# Patient Record
Sex: Male | Born: 1980
Health system: Southern US, Community
[De-identification: ages and names within clinical notes are randomized; demographics above are authoritative.]

## PROBLEM LIST (undated history)

## (undated) DIAGNOSIS — G43909 Migraine, unspecified, not intractable, without status migrainosus: Secondary | ICD-10-CM

## (undated) DIAGNOSIS — I10 Essential (primary) hypertension: Secondary | ICD-10-CM

## (undated) DIAGNOSIS — N200 Calculus of kidney: Secondary | ICD-10-CM

---

## 2011-01-15 ENCOUNTER — Other Ambulatory Visit: Payer: Self-pay | Admitting: Neurology

## 2011-01-15 DIAGNOSIS — M542 Cervicalgia: Secondary | ICD-10-CM

## 2011-01-20 ENCOUNTER — Other Ambulatory Visit: Payer: Self-pay | Admitting: Neurology

## 2011-01-20 DIAGNOSIS — M542 Cervicalgia: Secondary | ICD-10-CM

## 2011-01-21 ENCOUNTER — Ambulatory Visit
Admission: RE | Admit: 2011-01-21 | Discharge: 2011-01-21 | Disposition: A | Discharge status: Home / Self Care | Payer: Worker's Compensation | Source: Ambulatory Visit | Attending: Neurology | Admitting: Neurology

## 2011-01-21 ENCOUNTER — Other Ambulatory Visit: Payer: Self-pay | Admitting: Neurology

## 2011-01-21 DIAGNOSIS — M542 Cervicalgia: Secondary | ICD-10-CM

## 2012-04-07 ENCOUNTER — Emergency Department (HOSPITAL_COMMUNITY)
Admission: EM | Admit: 2012-04-07 | Discharge: 2012-04-08 | Disposition: A | Discharge status: Home / Self Care | Payer: Self-pay | Attending: Emergency Medicine | Admitting: Emergency Medicine

## 2012-04-07 ENCOUNTER — Emergency Department (HOSPITAL_COMMUNITY): Payer: Self-pay

## 2012-04-07 ENCOUNTER — Encounter (HOSPITAL_COMMUNITY): Payer: Self-pay | Admitting: Emergency Medicine

## 2012-04-07 DIAGNOSIS — M2550 Pain in unspecified joint: Secondary | ICD-10-CM | POA: Insufficient documentation

## 2012-04-07 DIAGNOSIS — S8263XA Displaced fracture of lateral malleolus of unspecified fibula, initial encounter for closed fracture: Secondary | ICD-10-CM | POA: Insufficient documentation

## 2012-04-07 DIAGNOSIS — Y929 Unspecified place or not applicable: Secondary | ICD-10-CM | POA: Insufficient documentation

## 2012-04-07 DIAGNOSIS — X58XXXA Exposure to other specified factors, initial encounter: Secondary | ICD-10-CM | POA: Insufficient documentation

## 2012-04-07 DIAGNOSIS — M2540 Effusion, unspecified joint: Secondary | ICD-10-CM | POA: Insufficient documentation

## 2012-04-07 DIAGNOSIS — Y9366 Activity, soccer: Secondary | ICD-10-CM | POA: Insufficient documentation

## 2012-04-07 NOTE — ED Notes (Signed)
Pt alert, arrives from home, c/o right ankle pain, onset was this evening, pt was playing soccer, + edema, ice applied per pt, resp even unlabored, skin pwd

## 2012-04-07 NOTE — ED Notes (Signed)
Patient transported to X-ray 

## 2012-04-08 ENCOUNTER — Encounter (HOSPITAL_COMMUNITY): Payer: Self-pay | Admitting: Emergency Medicine

## 2012-04-08 ENCOUNTER — Emergency Department (HOSPITAL_COMMUNITY)
Admission: EM | Admit: 2012-04-08 | Discharge: 2012-04-08 | Disposition: A | Discharge status: Home / Self Care | Payer: Self-pay | Attending: Emergency Medicine | Admitting: Emergency Medicine

## 2012-04-08 ENCOUNTER — Emergency Department (HOSPITAL_COMMUNITY): Payer: Self-pay

## 2012-04-08 DIAGNOSIS — S8990XA Unspecified injury of unspecified lower leg, initial encounter: Secondary | ICD-10-CM | POA: Insufficient documentation

## 2012-04-08 DIAGNOSIS — M25473 Effusion, unspecified ankle: Secondary | ICD-10-CM | POA: Insufficient documentation

## 2012-04-08 DIAGNOSIS — X58XXXA Exposure to other specified factors, initial encounter: Secondary | ICD-10-CM | POA: Insufficient documentation

## 2012-04-08 DIAGNOSIS — S99919A Unspecified injury of unspecified ankle, initial encounter: Secondary | ICD-10-CM

## 2012-04-08 DIAGNOSIS — Y929 Unspecified place or not applicable: Secondary | ICD-10-CM | POA: Insufficient documentation

## 2012-04-08 DIAGNOSIS — S93409A Sprain of unspecified ligament of unspecified ankle, initial encounter: Secondary | ICD-10-CM | POA: Insufficient documentation

## 2012-04-08 DIAGNOSIS — M25476 Effusion, unspecified foot: Secondary | ICD-10-CM | POA: Insufficient documentation

## 2012-04-08 DIAGNOSIS — Y939 Activity, unspecified: Secondary | ICD-10-CM | POA: Insufficient documentation

## 2012-04-08 MED ORDER — HYDROCODONE-ACETAMINOPHEN 5-325 MG PO TABS
1.0000 | ORAL_TABLET | Freq: Once | ORAL | Status: AC
  Administered 2012-04-08: 1 via ORAL
  Filled 2012-04-08: qty 1

## 2012-04-08 MED ORDER — DIAZEPAM 5 MG/ML IJ SOLN
5.0000 mg | Freq: Once | INTRAMUSCULAR | Status: AC
  Administered 2012-04-08: 10 mg via INTRAVENOUS
  Filled 2012-04-08: qty 2

## 2012-04-08 MED ORDER — HYDROCODONE-ACETAMINOPHEN 5-500 MG PO TABS: 1.0000 | ORAL_TABLET | Freq: Four times a day (QID) | ORAL | Status: DC | PRN

## 2012-04-08 MED ORDER — OXYCODONE-ACETAMINOPHEN 5-325 MG PO TABS: 1.0000 | ORAL_TABLET | ORAL | Status: DC | PRN

## 2012-04-08 MED ORDER — HYDROMORPHONE HCL PF 1 MG/ML IJ SOLN
1.0000 mg | Freq: Once | INTRAMUSCULAR | Status: AC
  Administered 2012-04-08: 1 mg via INTRAVENOUS
  Filled 2012-04-08: qty 1

## 2012-04-08 MED ORDER — IBUPROFEN 600 MG PO TABS: 600.0000 mg | ORAL_TABLET | Freq: Four times a day (QID) | ORAL | Status: DC | PRN

## 2012-04-08 MED ORDER — CYCLOBENZAPRINE HCL 10 MG PO TABS: 10.0000 mg | ORAL_TABLET | Freq: Two times a day (BID) | ORAL | Status: DC | PRN

## 2012-04-08 NOTE — ED Provider Notes (Signed)
History     CSN: [id number]  Arrival date & time 04/07/12  2232   First MD Initiated Contact with Patient 04/07/12 2340      Chief Complaint  Patient presents with  . Ankle Pain    (Consider location/radiation/quality/duration/timing/severity/associated sxs/prior treatment) HPI Joseph Foley is a 31 y.o. male who presents with complaint of rolling right ankle while playing soccer. Pt states he has pain to the outside of the ankle, swelling, and deformity. Denies injury to the same ankle in the past. Denies any pain in the knee. No other injuries.    History reviewed. No pertinent past medical history.  History reviewed. No pertinent past surgical history.  No family history on file.  History  Substance Use Topics  . Smoking status: Never Smoker   . Smokeless tobacco: Not on file  . Alcohol Use: No      Review of Systems  Constitutional: Negative for fever and chills.  Respiratory: Negative.   Cardiovascular: Negative.   Musculoskeletal: Positive for joint swelling and arthralgias.  Skin: Negative.   Neurological: Negative for weakness and numbness.    Allergies  Review of patient's allergies indicates no known allergies.  Home Medications  No current outpatient prescriptions on file.  BP 142/70  Pulse 79  Temp 98 F (36.7 C) (Oral)  Resp 16  Wt 180 lb (81.647 kg)  SpO2 99%  Physical Exam  Nursing note and vitals reviewed. Constitutional: He appears well-developed and well-nourished. No distress.  HENT:  Head: Atraumatic.  Eyes: Conjunctivae normal are normal.  Neck: Neck supple.  Cardiovascular: Normal rate, regular rhythm and normal heart sounds.   Pulmonary/Chest: Effort normal and breath sounds normal. No respiratory distress. He has no wheezes. He has no rales.  Musculoskeletal: He exhibits edema and tenderness.       Right ankle appears swollen, more over the lateral maleollus. Tender to palpation over lateral and medial maelloli. Pain with any  rom of the joint. Normal dorsal pedal pulse. Normal toe sensation and rom. Normal knee exam.   Neurological: He is alert.  Skin: Skin is warm and dry.    ED Course  Procedures (including critical care time)  Labs Reviewed - No data to display Dg Ankle Complete Right  04/07/2012  *RADIOLOGY REPORT*  Clinical Data: Ankle pain, swelling.  Soccer injury.  RIGHT ANKLE - COMPLETE 3+ VIEW  Comparison: None.  Findings: Lateral soft tissue swelling.  There is an avulsion fracture of the tip of the lateral malleolus.  No tibial abnormality.  IMPRESSION: Avulsion fracture off the tip of the lateral malleolus.   Original Report Authenticated By: Charlett Nose, M.D.      1. Avulsion fracture of lateral malleolus       MDM  Pt with right ankle injury while playing soccer. No other injuries. Ankle is swollen. X-ray showing avulsion fracture of the tip of lateral malleolus. Ankle/foot neurovascularly intact. CAM walker and crutches provided, will d/c home with orthopedics follow up.           Lottie Mussel, PA 04/08/12 [phone]

## 2012-04-08 NOTE — ED Notes (Signed)
Oxygen saturation decreased to 81% after receiving medication IV for pain,  Pt placed on 3L [place]  Increased to 99 %.  Pt is calmer and resting more comfortably

## 2012-04-08 NOTE — ED Notes (Signed)
Pedal pulse present and good cap refill in toes of injured foot.

## 2012-04-08 NOTE — ED Provider Notes (Signed)
  Medical screening examination/treatment/procedure(s) were performed by non-physician practitioner and as supervising physician I was immediately available for consultation/collaboration.    Vida Roller, MD 04/08/12 2322

## 2012-04-08 NOTE — ED Notes (Signed)
Pt alert, arrives from home, c/o cont pain associated with previous injury, per family prescriptions filled, "he just took a vicodin", resp even unlabored, skin pwd

## 2012-04-08 NOTE — ED Provider Notes (Signed)
  Medical screening examination/treatment/procedure(s) were performed by non-physician practitioner and as supervising physician I was immediately available for consultation/collaboration.    Tandrea Kommer D Daaron Dimarco, MD 04/08/12 2322 

## 2012-04-08 NOTE — ED Provider Notes (Signed)
History     CSN: [id number]  Arrival date & time 04/08/12  0330   First MD Initiated Contact with Patient 04/08/12 0406      Chief Complaint  Patient presents with  . Ankle Pain    (Consider location/radiation/quality/duration/timing/severity/associated sxs/prior treatment) Patient is a 31 y.o. male presenting with ankle pain. The history is provided by the patient.  Ankle Pain  The incident occurred 6 to 12 hours ago. Pertinent negatives include no numbness. Associated symptoms comments: He was seen here earlier tonight for right ankle injury with x-rays that showed an ankle sprain. He returns with severe pain and significant swelling and discoloration of the ankle. He took the pain medication given by prescription but reports no change or relief. .    History reviewed. No pertinent past medical history.  History reviewed. No pertinent past surgical history.  No family history on file.  History  Substance Use Topics  . Smoking status: Never Smoker   . Smokeless tobacco: Not on file  . Alcohol Use: No      Review of Systems  Constitutional: Negative for fever and chills.  Musculoskeletal: Negative.        See HPI  Skin: Positive for color change. Negative for wound.  Neurological: Negative.  Negative for numbness.    Allergies  Review of patient's allergies indicates no known allergies.  Home Medications   Current Outpatient Rx  Name  Route  Sig  Dispense  Refill  . HYDROCODONE-ACETAMINOPHEN 5-500 MG PO TABS   Oral   Take 1-2 tablets by mouth every 6 (six) hours as needed for pain.   15 tablet   0   . IBUPROFEN 600 MG PO TABS   Oral   Take 1 tablet (600 mg total) by mouth every 6 (six) hours as needed for pain.   30 tablet   0     BP 137/75  Pulse 82  Temp 98.6 F (37 C) (Oral)  Resp 22  SpO2 100%  Physical Exam  Constitutional: He is oriented to person, place, and time. He appears well-developed and well-nourished.  Neck: Normal range of  motion.  Cardiovascular:       Capillary refill in right foot WNL.  Pulmonary/Chest: Effort normal.  Musculoskeletal: Normal range of motion.       Marked swelling of left ankle. No obvious bony deformity. Swelling extends to forefoot without ecchymosis. Achilles intact. No calf tenderness.   Neurological: He is alert and oriented to person, place, and time.  Skin: Skin is warm and dry.       Ecchymosis of right ankle.   Psychiatric: He has a normal mood and affect.    ED Course  Procedures (including critical care time)  Labs Reviewed - No data to display Dg Ankle Complete Right  04/07/2012  *RADIOLOGY REPORT*  Clinical Data: Ankle pain, swelling.  Soccer injury.  RIGHT ANKLE - COMPLETE 3+ VIEW  Comparison: None.  Findings: Lateral soft tissue swelling.  There is an avulsion fracture of the tip of the lateral malleolus.  No tibial abnormality.  IMPRESSION: Avulsion fracture off the tip of the lateral malleolus.   Original Report Authenticated By: Charlett Nose, M.D.      No diagnosis found.  1. Ankle injury.  MDM  Ankle is significantly swollen with pain out of proportion to diagnosis of sprain. CT negative. Ice and elevation encouraged to reduce swelling. Pain managed in ED. Cap refill, DP pulses present and normal on re-evaluation. Ortho follow up  also strongly encouraged as ligament or tendon rupture cannot be excluded.         Rodena Medin, PA-C 04/08/12 [phone]

## 2012-04-21 ENCOUNTER — Encounter (HOSPITAL_COMMUNITY): Payer: Self-pay | Admitting: *Deleted

## 2012-04-21 ENCOUNTER — Emergency Department (HOSPITAL_COMMUNITY): Payer: Self-pay

## 2012-04-21 ENCOUNTER — Emergency Department (HOSPITAL_COMMUNITY)
Admission: EM | Admit: 2012-04-21 | Discharge: 2012-04-22 | Disposition: A | Discharge status: Home / Self Care | Payer: Self-pay | Attending: Emergency Medicine | Admitting: Emergency Medicine

## 2012-04-21 DIAGNOSIS — R51 Headache: Secondary | ICD-10-CM | POA: Insufficient documentation

## 2012-04-21 DIAGNOSIS — R42 Dizziness and giddiness: Secondary | ICD-10-CM | POA: Insufficient documentation

## 2012-04-21 DIAGNOSIS — Z8781 Personal history of (healed) traumatic fracture: Secondary | ICD-10-CM | POA: Insufficient documentation

## 2012-04-21 DIAGNOSIS — R5383 Other fatigue: Secondary | ICD-10-CM | POA: Insufficient documentation

## 2012-04-21 DIAGNOSIS — R079 Chest pain, unspecified: Secondary | ICD-10-CM

## 2012-04-21 DIAGNOSIS — R5381 Other malaise: Secondary | ICD-10-CM | POA: Insufficient documentation

## 2012-04-21 DIAGNOSIS — R0602 Shortness of breath: Secondary | ICD-10-CM | POA: Insufficient documentation

## 2012-04-21 DIAGNOSIS — R0789 Other chest pain: Secondary | ICD-10-CM | POA: Insufficient documentation

## 2012-04-21 LAB — CBC
Hemoglobin: 14.7 g/dL (ref 13.0–17.0)
MCH: 30.6 pg (ref 26.0–34.0)
RBC: 4.8 MIL/uL (ref 4.22–5.81)

## 2012-04-21 LAB — BASIC METABOLIC PANEL
CO2: 26 mEq/L (ref 19–32)
Chloride: 98 mEq/L (ref 96–112)
GFR calc non Af Amer: >90 mL/min (ref >90)
Glucose, Bld: 115 mg/dL — ABNORMAL HIGH (ref 70–99)
Potassium: 3.9 mEq/L (ref 3.5–5.1)
Sodium: 136 mEq/L (ref 135–145)

## 2012-04-21 LAB — POCT I-STAT TROPONIN I: Troponin i, poc: 0 ng/mL (ref 0.00–0.08)

## 2012-04-21 MED ORDER — ONDANSETRON 4 MG PO TBDP
4.0000 mg | ORAL_TABLET | Freq: Once | ORAL | Status: DC
  Filled 2012-04-21: qty 1

## 2012-04-21 MED ORDER — METOCLOPRAMIDE HCL 5 MG/ML IJ SOLN
10.0000 mg | Freq: Once | INTRAMUSCULAR | Status: AC
  Administered 2012-04-21: 10 mg via INTRAMUSCULAR
  Filled 2012-04-21: qty 2

## 2012-04-21 NOTE — ED Notes (Signed)
MD at bedside. 

## 2012-04-21 NOTE — ED Notes (Signed)
Pt c/o dizziness, vomiting x 1 and sensation of being unable to catch his breath. Pt denies lung disease. Pt is in no respiratory distress at this time.

## 2012-04-21 NOTE — ED Provider Notes (Signed)
9:47 PM  Date: 04/21/2012  Rate:95  Rhythm: normal sinus rhythm  QRS Axis: normal  Intervals: normal PQRS:  Left atrial abnormality.  ST/T Wave abnormalities: Inverted T waves in inferior and lateral leads.  Conduction Disutrbances:none  Narrative Interpretation: Abnormal EKG  Old EKG Reviewed: none available    Joseph Cooper III, MD 04/21/12 2149

## 2012-04-21 NOTE — ED Provider Notes (Addendum)
History     CSN: [id number]  Arrival date & time 04/21/12  2105   First MD Initiated Contact with Patient 04/21/12 2216      Chief Complaint  Patient presents with  . Emesis  . Shortness of Breath  . Dizziness    (Consider location/radiation/quality/duration/timing/severity/associated sxs/prior treatment) The history is provided by the patient.  Joseph Foley is a 31 y.o. male here with chest pain, SOB. Around 2pm today, he had acute onset chest pain and SOB. Pain is intermittent, sense of pressure. He then vomited and felt better. He also felt that he was unable to catch his breath. He had a L ankle fracture 2 weeks ago and still wearing a boot. No hx of DVT/PE. He also felt generalized weakness, dizziness, and headache. No risk factors for ACS.   History reviewed. No pertinent past medical history.  History reviewed. No pertinent past surgical history.  History reviewed. No pertinent family history.  History  Substance Use Topics  . Smoking status: Never Smoker   . Smokeless tobacco: Not on file  . Alcohol Use: No      Review of Systems  Respiratory: Positive for shortness of breath.   Cardiovascular: Positive for chest pain.  All other systems reviewed and are negative.    Allergies  Review of patient's allergies indicates no known allergies.  Home Medications   Current Outpatient Rx  Name  Route  Sig  Dispense  Refill  . CYCLOBENZAPRINE HCL 10 MG PO TABS   Oral   Take 1 tablet (10 mg total) by mouth 2 (two) times daily as needed for muscle spasms.   20 tablet   0   . IBUPROFEN 600 MG PO TABS   Oral   Take 1 tablet (600 mg total) by mouth every 6 (six) hours as needed for pain.   30 tablet   0   . OXYCODONE-ACETAMINOPHEN 5-325 MG PO TABS   Oral   Take 1 tablet by mouth every 4 (four) hours as needed for pain.   20 tablet   0     BP 150/89  Pulse 101  Temp 98.3 F (36.8 C) (Oral)  Resp 18  SpO2 100%  Physical Exam  Nursing note and vitals  reviewed. Constitutional: He is oriented to person, place, and time. He appears well-developed and well-nourished.       NAD   HENT:  Head: Normocephalic.  Mouth/Throat: Oropharynx is clear and moist.  Eyes: Conjunctivae normal are normal. Pupils are equal, round, and reactive to light.  Neck: Normal range of motion. Neck supple.  Cardiovascular: Normal rate, regular rhythm and normal heart sounds.   Pulmonary/Chest: Effort normal and breath sounds normal. No respiratory distress. He has no wheezes. He has no rales.  Abdominal: Soft. Bowel sounds are normal. He exhibits no distension. There is no tenderness. There is no rebound.  Musculoskeletal: Normal range of motion.       R walking boot in place. R leg mildly swollen, no calf tenderness.   Neurological: He is alert and oriented to person, place, and time.  Skin: Skin is warm and dry.  Psychiatric: He has a normal mood and affect. His behavior is normal. Judgment and thought content normal.    ED Course  Procedures (including critical care time)  Labs Reviewed  BASIC METABOLIC PANEL - Abnormal; Notable for the following:    Glucose, Bld 115 (*)     All other components within normal limits  CBC - Abnormal; Notable  for the following:    WBC 13.9 (*)     All other components within normal limits  POCT I-STAT TROPONIN I  D-DIMER, QUANTITATIVE  TROPONIN I   Dg Chest 2 View  04/21/2012  *RADIOLOGY REPORT*  Clinical Data: Short of breath.  Dizziness.  Vomiting.  CHEST - 2 VIEW  Comparison: None.  Findings:  Cardiopericardial silhouette within normal limits. Mediastinal contours normal. Trachea midline.  No airspace disease or effusion.  BB projects over the right upper chest.  This is not seen on the lateral view. Monitoring leads are projected over the chest.  IMPRESSION: No acute cardiopulmonary disease.   Original Report Authenticated By: Andreas Newport, M.D.      1. Chest pain   2. Headache      Date: 04/21/2012  Rate:  95  Rhythm: normal sinus rhythm  QRS Axis: normal  Intervals: normal  ST/T Wave abnormalities: TWI inferiorly  Conduction Disutrbances:none  Narrative Interpretation:   Old EKG Reviewed: none available    MDM  Sherril Hertenstein is a 31 y.o. male here with SOB, CP. Low risk for ACS and trop neg x 1. But need to consider PE given recent R leg immobilization from ankle fracture. Will check d-dimer, labs and reassess.   11:24 PM Patient still has some headache. Headache likely tension headache and there are no red flags for subarachnoid hemorrhage. D-dimer neg. Trop neg x 1, but EKG abnormal with no previous. Will get trop at 1am. I signed out to Dr. Patria Mane in the ED.        Richardean Canal, MD 04/21/12 2324  Richardean Canal, MD 04/21/12 [phone]

## 2012-04-22 LAB — TROPONIN I: Troponin I: <0.3 ng/mL (ref <0.30)

## 2012-04-22 MED ORDER — MORPHINE SULFATE 4 MG/ML IJ SOLN
4.0000 mg | Freq: Once | INTRAMUSCULAR | Status: AC
  Administered 2012-04-22: 4 mg via INTRAVENOUS
  Filled 2012-04-22: qty 1

## 2012-04-22 MED ORDER — ONDANSETRON 8 MG PO TBDP: 8.0000 mg | ORAL_TABLET | Freq: Three times a day (TID) | ORAL | Status: DC | PRN

## 2012-04-22 MED ORDER — ONDANSETRON HCL 4 MG/2ML IJ SOLN
4.0000 mg | Freq: Once | INTRAMUSCULAR | Status: AC
  Administered 2012-04-22: 4 mg via INTRAVENOUS
  Filled 2012-04-22: qty 2

## 2012-04-22 NOTE — ED Provider Notes (Signed)
1:27 AM The patient is feeling somewhat better at this time.  Troponin x2 is normal.  The patient's chest pain does not sound cardiac in nature.  The patient has no risk factors.  His d-dimer is normal.  His headache is nonfocal nonspecific.  The patient is feeling better at this time.  Discharge home in good condition.  He understands to return to the ER for new or worsening symptoms  Lyanne Co, MD 04/22/12 [phone]

## 2014-04-30 ENCOUNTER — Emergency Department (HOSPITAL_BASED_OUTPATIENT_CLINIC_OR_DEPARTMENT_OTHER)
Admission: EM | Admit: 2014-04-30 | Discharge: 2014-04-30 | Disposition: A | Discharge status: Home / Self Care | Payer: Commercial Managed Care - PPO | Attending: Emergency Medicine | Admitting: Emergency Medicine

## 2014-04-30 ENCOUNTER — Encounter (HOSPITAL_BASED_OUTPATIENT_CLINIC_OR_DEPARTMENT_OTHER): Payer: Self-pay | Admitting: *Deleted

## 2014-04-30 DIAGNOSIS — Z87442 Personal history of urinary calculi: Secondary | ICD-10-CM | POA: Insufficient documentation

## 2014-04-30 DIAGNOSIS — Z79899 Other long term (current) drug therapy: Secondary | ICD-10-CM | POA: Diagnosis not present

## 2014-04-30 DIAGNOSIS — R1013 Epigastric pain: Secondary | ICD-10-CM | POA: Insufficient documentation

## 2014-04-30 HISTORY — DX: Calculus of kidney: N20.0

## 2014-04-30 LAB — URINALYSIS, ROUTINE W REFLEX MICROSCOPIC
Glucose, UA: NEGATIVE mg/dL
Ketones, ur: 15 mg/dL — AB
LEUKOCYTES UA: NEGATIVE
NITRITE: NEGATIVE
Protein, ur: NEGATIVE mg/dL
SPECIFIC GRAVITY, URINE: 1.029 (ref 1.005–1.030)
UROBILINOGEN UA: 1 mg/dL (ref 0.0–1.0)
pH: 6 (ref 5.0–8.0)

## 2014-04-30 LAB — COMPREHENSIVE METABOLIC PANEL
ALK PHOS: 69 U/L (ref 39–117)
ALT: 27 U/L (ref 0–53)
AST: 26 U/L (ref 0–37)
Albumin: 5.1 g/dL (ref 3.5–5.2)
Anion gap: 8 (ref 5–15)
BUN: 14 mg/dL (ref 6–23)
CO2: 27 mmol/L (ref 19–32)
Calcium: 9.7 mg/dL (ref 8.4–10.5)
Chloride: 103 mEq/L (ref 96–112)
Creatinine, Ser: 0.88 mg/dL (ref 0.50–1.35)
GFR calc non Af Amer: >90 mL/min (ref >90)
GLUCOSE: 114 mg/dL — AB (ref 70–99)
POTASSIUM: 3.3 mmol/L — AB (ref 3.5–5.1)
SODIUM: 138 mmol/L (ref 135–145)
TOTAL PROTEIN: 7.9 g/dL (ref 6.0–8.3)
Total Bilirubin: 1.4 mg/dL — ABNORMAL HIGH (ref 0.3–1.2)

## 2014-04-30 LAB — CBC WITH DIFFERENTIAL/PLATELET
Basophils Absolute: 0 10*3/uL (ref 0.0–0.1)
Basophils Relative: 1 % (ref 0–1)
EOS ABS: 0.1 10*3/uL (ref 0.0–0.7)
Eosinophils Relative: 2 % (ref 0–5)
HCT: 42.2 % (ref 39.0–52.0)
Hemoglobin: 14.6 g/dL (ref 13.0–17.0)
LYMPHS ABS: 2.4 10*3/uL (ref 0.7–4.0)
LYMPHS PCT: 30 % (ref 12–46)
MCH: 31.3 pg (ref 26.0–34.0)
MCHC: 34.6 g/dL (ref 30.0–36.0)
MCV: 90.4 fL (ref 78.0–100.0)
Monocytes Absolute: 0.5 10*3/uL (ref 0.1–1.0)
Monocytes Relative: 6 % (ref 3–12)
NEUTROS PCT: 61 % (ref 43–77)
Neutro Abs: 4.9 10*3/uL (ref 1.7–7.7)
PLATELETS: 294 10*3/uL (ref 150–400)
RBC: 4.67 MIL/uL (ref 4.22–5.81)
RDW: 12.1 % (ref 11.5–15.5)
WBC: 8 10*3/uL (ref 4.0–10.5)

## 2014-04-30 LAB — URINE MICROSCOPIC-ADD ON

## 2014-04-30 LAB — LIPASE, BLOOD: Lipase: 27 U/L (ref 11–59)

## 2014-04-30 MED ORDER — FAMOTIDINE 20 MG PO TABS
20.0000 mg | ORAL_TABLET | Freq: Once | ORAL | Status: AC
  Administered 2014-04-30: 20 mg via ORAL
  Filled 2014-04-30: qty 1

## 2014-04-30 MED ORDER — MORPHINE SULFATE 2 MG/ML IJ SOLN
INTRAMUSCULAR | Status: AC
  Filled 2014-04-30: qty 1

## 2014-04-30 MED ORDER — MORPHINE SULFATE 4 MG/ML IJ SOLN
INTRAMUSCULAR | Status: AC
  Filled 2014-04-30: qty 1

## 2014-04-30 MED ORDER — TRAMADOL HCL 50 MG PO TABS: 50.0000 mg | ORAL_TABLET | Freq: Four times a day (QID) | ORAL | Status: DC | PRN

## 2014-04-30 MED ORDER — OMEPRAZOLE 20 MG PO CPDR: 40.0000 mg | DELAYED_RELEASE_CAPSULE | Freq: Every day | ORAL | Status: DC

## 2014-04-30 MED ORDER — MORPHINE SULFATE 4 MG/ML IJ SOLN
6.0000 mg | Freq: Once | INTRAMUSCULAR | Status: AC
  Administered 2014-04-30: 6 mg via INTRAVENOUS
  Filled 2014-04-30: qty 2

## 2014-04-30 NOTE — ED Notes (Signed)
Pt c/o epigastric pain x 1 day

## 2014-04-30 NOTE — ED Provider Notes (Signed)
CSN: [id number]     Arrival date & time 04/30/14  1825 History  This chart was scribed for Enid SkeensJoshua M Alfredo Spong, MD by Roxy Cedarhandni Bhalodia, ED Scribe. This patient was seen in room MH05/MH05 and the patient's care was started at 7:22 PM.   Chief Complaint  Patient presents with  . Abdominal Pain   Patient is a 33 y.o. male presenting with abdominal pain. The history is provided by the patient. No language interpreter was used.  Abdominal Pain Pain location:  RUQ and epigastric Pain quality: aching, burning, heavy and pressure   Pain radiates to:  Back Pain severity:  Severe Onset quality:  Gradual Timing:  Constant Progression:  Waxing and waning  HPI Comments: Joseph Foley is a 33 y.o. male with PMHx of H. Pylori, and kidney stones, who presents to the Emergency Department complaining of moderate, constant epigastric and RUQ abdominal pain that waxes and wanes which began last night. He describes that pain as increased pressure and burning sensation. He states that the pain radiates to his back intermittently. He states that the pain has increased since arriving to ED. He denies eating fatty meals, or increased alcohol intake. He denies associated fevers, chills, nausea, vomiting, diarrhea, or rash.  Past Medical History  Diagnosis Date  . Kidney stones    History reviewed. No pertinent past surgical history. History reviewed. No pertinent family history. History  Substance Use Topics  . Smoking status: Never Smoker   . Smokeless tobacco: Not on file  . Alcohol Use: No   Review of Systems  Gastrointestinal: Positive for abdominal pain.  All other systems reviewed and are negative.  Allergies  Review of patient's allergies indicates no known allergies.  Home Medications   Prior to Admission medications   Medication Sig Start Date End Date Taking? Authorizing Provider  omeprazole (PRILOSEC) 20 MG capsule Take 2 capsules (40 mg total) by mouth daily. 04/30/14   Enid SkeensJoshua M Psalms Olarte, MD   traMADol (ULTRAM) 50 MG tablet Take 1 tablet (50 mg total) by mouth every 6 (six) hours as needed. 04/30/14   Enid SkeensJoshua M Rinoa Garramone, MD   Triage Vitals: BP 144/77 mmHg  Pulse 94  Temp(Src) 98.7 F (37.1 C) (Oral)  Resp 16  Ht 5\' 8"  (1.727 m)  Wt 200 lb (90.719 kg)  BMI 30.42 kg/m2  SpO2 100%  Physical Exam  Constitutional: He is oriented to person, place, and time. He appears well-developed and well-nourished. No distress.  HENT:  Head: Normocephalic and atraumatic.  Eyes: Conjunctivae and EOM are normal. Pupils are equal, round, and reactive to light.  No scleral ictus noted.  Neck: Neck supple. No tracheal deviation present.  Cardiovascular: Normal rate, regular rhythm and normal heart sounds.   Pulmonary/Chest: Effort normal. No respiratory distress.  Abdominal: Soft. He exhibits no distension. There is tenderness. There is no guarding.  Mild epigastric discomfort.  Musculoskeletal: Normal range of motion.  Lymphadenopathy:    He has no cervical adenopathy.  Neurological: He is alert and oriented to person, place, and time.  Skin: Skin is warm and dry.  Psychiatric: He has a normal mood and affect. His behavior is normal.  Nursing note and vitals reviewed.  ED Course  Procedures (including critical care time)  EMERGENCY DEPARTMENT BILIARY ULTRASOUND INTERPRETATION "Study: Limited Abdominal Ultrasound of the gallbladder and common bile duct."  INDICATIONS: Abdominal pain and Nausea Indication: Multiple views of the gallbladder and common bile duct were obtained in real-time with a Multi-frequency probe." PERFORMED BY:  Myself  IMAGES ARCHIVED?: Yes FINDINGS: Gallstones absent, Gallbladder wall normal in thickness, Sonographic Murphy's sign absent and Common bile duct normal in size LIMITATIONS: Bowel Gas INTERPRETATION: Normal   DIAGNOSTIC STUDIES: Oxygen Saturation is 100% on RA, normal by my interpretation.    COORDINATION OF CARE: 7:28 PM- Discussed plans to order  diagnostic lab work and EKG. Will give patient morphine and pepcid for abdominal pain management. Will refer patient to a GI specialist. Pt advised of plan for treatment and pt agrees.  Labs Review Labs Reviewed  URINALYSIS, ROUTINE W REFLEX MICROSCOPIC - Abnormal; Notable for the following:    Color, Urine AMBER (*)    Hgb urine dipstick SMALL (*)    Bilirubin Urine SMALL (*)    Ketones, ur 15 (*)    All other components within normal limits  COMPREHENSIVE METABOLIC PANEL - Abnormal; Notable for the following:    Potassium 3.3 (*)    Glucose, Bld 114 (*)    Total Bilirubin 1.4 (*)    All other components within normal limits  CBC WITH DIFFERENTIAL  LIPASE, BLOOD  URINE MICROSCOPIC-ADD ON   Imaging Review No results found.   EKG Interpretation   Date/Time:  Tuesday April 30 2014 18:46:11 EST Ventricular Rate:  85 PR Interval:  138 QRS Duration: 92 QT Interval:  370 QTC Calculation: 440 R Axis:   57 Text Interpretation:  Normal sinus rhythm T wave abnormality, consider  inferior ischemia Abnormal ECG Confirmed by Gavan Nordby  MD, Virdie Penning ([id number]) on  04/30/2014 7:24:04 PM     MDM   Final diagnoses:  Epigastric abdominal pain     I personally performed the services described in this documentation, which was scribed in my presence. The recorded information has been reviewed and is accurate.  Patient with epigastric discomfort, no guarding or signs of peritonitis. Discussed differential diagnosis including biliary, ulcer, gastric/H pylori, hernia, other. Blood work no acute findings, mild elevated bilirubin. Pain medicines given in the ER. Patient does not have any chest pain or shortness of breath no headache history. EKG nonspecific findings done in triage.  Bedside ultrasound no acute gallbladder findings. Follow-up with gastroenterology discussed. PPI. Results and differential diagnosis were discussed with the patient/parent/guardian. Close follow up outpatient was  discussed, comfortable with the plan.   Medications  morphine 2 MG/ML injection (not administered)  morphine 4 MG/ML injection 6 mg (6 mg Intravenous Given 04/30/14 1939)  famotidine (PEPCID) tablet 20 mg (20 mg Oral Given 04/30/14 1942)    Filed Vitals:   04/30/14 1830  BP: 144/77  Pulse: 94  Temp: 98.7 F (37.1 C)  TempSrc: Oral  Resp: 16  Height: 5\' 8"  (1.727 m)  Weight: 200 lb (90.719 kg)  SpO2: 100%    Final diagnoses:  Epigastric abdominal pain      Enid SkeensJoshua M Fontaine Hehl, MD 04/30/14 2031

## 2014-04-30 NOTE — Discharge Instructions (Signed)
If you were given medicines take as directed.  If you are on coumadin or contraceptives realize their levels and effectiveness is altered by many different medicines.  If you have any reaction (rash, tongues swelling, other) to the medicines stop taking and see a physician.   Please follow up as directed and return to the ER or see a physician for new or worsening symptoms.  Thank you. Filed Vitals:   04/30/14 1830  BP: 144/77  Pulse: 94  Temp: 98.7 F (37.1 C)  TempSrc: Oral  Resp: 16  Height: 5\' 8"  (1.727 m)  Weight: 200 lb (90.719 kg)  SpO2: 100%

## 2014-10-08 ENCOUNTER — Encounter (HOSPITAL_BASED_OUTPATIENT_CLINIC_OR_DEPARTMENT_OTHER): Payer: Self-pay | Admitting: Emergency Medicine

## 2014-10-08 ENCOUNTER — Emergency Department (HOSPITAL_BASED_OUTPATIENT_CLINIC_OR_DEPARTMENT_OTHER)
Admission: EM | Admit: 2014-10-08 | Discharge: 2014-10-08 | Disposition: A | Discharge status: Home / Self Care | Payer: BLUE CROSS/BLUE SHIELD | Attending: Emergency Medicine | Admitting: Emergency Medicine

## 2014-10-08 DIAGNOSIS — R109 Unspecified abdominal pain: Secondary | ICD-10-CM | POA: Insufficient documentation

## 2014-10-08 DIAGNOSIS — Z79899 Other long term (current) drug therapy: Secondary | ICD-10-CM | POA: Insufficient documentation

## 2014-10-08 DIAGNOSIS — Z87442 Personal history of urinary calculi: Secondary | ICD-10-CM | POA: Insufficient documentation

## 2014-10-08 LAB — URINALYSIS, ROUTINE W REFLEX MICROSCOPIC
BILIRUBIN URINE: NEGATIVE
GLUCOSE, UA: NEGATIVE mg/dL
Ketones, ur: NEGATIVE mg/dL
Leukocytes, UA: NEGATIVE
Nitrite: NEGATIVE
PH: 6 (ref 5.0–8.0)
Protein, ur: NEGATIVE mg/dL
Specific Gravity, Urine: 1.029 (ref 1.005–1.030)
Urobilinogen, UA: 0.2 mg/dL (ref 0.0–1.0)

## 2014-10-08 LAB — BASIC METABOLIC PANEL
Anion gap: 5 (ref 5–15)
BUN: 12 mg/dL (ref 6–20)
CALCIUM: 9.6 mg/dL (ref 8.9–10.3)
CO2: 28 mmol/L (ref 22–32)
CREATININE: 1.04 mg/dL (ref 0.61–1.24)
Chloride: 105 mmol/L (ref 101–111)
GFR calc non Af Amer: >60 mL/min (ref >60)
GLUCOSE: 94 mg/dL (ref 65–99)
POTASSIUM: 3.6 mmol/L (ref 3.5–5.1)
Sodium: 138 mmol/L (ref 135–145)

## 2014-10-08 LAB — URINE MICROSCOPIC-ADD ON

## 2014-10-08 LAB — CBC
HCT: 43.3 % (ref 39.0–52.0)
HEMOGLOBIN: 14.4 g/dL (ref 13.0–17.0)
MCH: 30.5 pg (ref 26.0–34.0)
MCHC: 33.3 g/dL (ref 30.0–36.0)
MCV: 91.7 fL (ref 78.0–100.0)
PLATELETS: 311 10*3/uL (ref 150–400)
RBC: 4.72 MIL/uL (ref 4.22–5.81)
RDW: 12.2 % (ref 11.5–15.5)
WBC: 8.6 10*3/uL (ref 4.0–10.5)

## 2014-10-08 MED ORDER — HYDROMORPHONE HCL 1 MG/ML IJ SOLN
1.0000 mg | Freq: Once | INTRAMUSCULAR | Status: AC
  Administered 2014-10-08: 1 mg via INTRAVENOUS
  Filled 2014-10-08: qty 1

## 2014-10-08 MED ORDER — SODIUM CHLORIDE 0.9 % IV BOLUS (SEPSIS)
1000.0000 mL | INTRAVENOUS | Status: AC
  Administered 2014-10-08: 1000 mL via INTRAVENOUS

## 2014-10-08 MED ORDER — TAMSULOSIN HCL 0.4 MG PO CAPS: 0.4000 mg | ORAL_CAPSULE | Freq: Every day | ORAL | Status: DC

## 2014-10-08 MED ORDER — KETOROLAC TROMETHAMINE 30 MG/ML IJ SOLN
30.0000 mg | Freq: Once | INTRAMUSCULAR | Status: AC
  Administered 2014-10-08: 30 mg via INTRAVENOUS
  Filled 2014-10-08: qty 1

## 2014-10-08 MED ORDER — CEPHALEXIN 500 MG PO CAPS: 500.0000 mg | ORAL_CAPSULE | Freq: Three times a day (TID) | ORAL | Status: AC

## 2014-10-08 MED ORDER — HYDROMORPHONE HCL 1 MG/ML IJ SOLN
1.0000 mg | Freq: Once | INTRAMUSCULAR | Status: AC
  Administered 2014-10-08: 1 mg via INTRAVENOUS

## 2014-10-08 MED ORDER — HYDROMORPHONE HCL 1 MG/ML IJ SOLN
INTRAMUSCULAR | Status: AC
  Filled 2014-10-08: qty 1

## 2014-10-08 MED ORDER — ONDANSETRON HCL 4 MG/2ML IJ SOLN
4.0000 mg | Freq: Once | INTRAMUSCULAR | Status: AC
  Administered 2014-10-08: 4 mg via INTRAVENOUS
  Filled 2014-10-08: qty 2

## 2014-10-08 MED ORDER — OXYCODONE-ACETAMINOPHEN 5-325 MG PO TABS: 1.0000 | ORAL_TABLET | Freq: Four times a day (QID) | ORAL | Status: DC | PRN

## 2014-10-08 NOTE — ED Notes (Signed)
Pt informed that transportation home would be needed prior to receiving heavier medication states he would call father in law who could drive him home. Mr Joseph Foley was medicated with toradol and zofran at this time.

## 2014-10-08 NOTE — ED Notes (Signed)
Pt reports left flank pain that radiates to groin onset 1 week

## 2014-10-08 NOTE — ED Notes (Signed)
MD at bedside. 

## 2014-10-08 NOTE — ED Provider Notes (Signed)
CSN: [id number]     Arrival date & time 10/08/14  0612 History   First MD Initiated Contact with Patient 10/08/14 0654     Chief Complaint  Patient presents with  . Flank Pain  . Testicle Pain     (Consider location/radiation/quality/duration/timing/severity/associated sxs/prior Treatment) Patient is a 34 y.o. male presenting with flank pain and testicular pain. The history is provided by the patient.  Flank Pain This is a new problem. The current episode started more than 2 days ago. Episode frequency: intermittent. The problem has been rapidly worsening. Pertinent negatives include no chest pain, no abdominal pain, no headaches and no shortness of breath. Nothing aggravates the symptoms. Nothing relieves the symptoms. He has tried nothing for the symptoms. The treatment provided no relief.  Testicle Pain Pertinent negatives include no chest pain, no abdominal pain, no headaches and no shortness of breath.    Past Medical History  Diagnosis Date  . Kidney stones    History reviewed. No pertinent past surgical history. History reviewed. No pertinent family history. History  Substance Use Topics  . Smoking status: Never Smoker   . Smokeless tobacco: Not on file  . Alcohol Use: No    Review of Systems  Constitutional: Negative for fever.  HENT: Negative for drooling and rhinorrhea.   Eyes: Negative for pain.  Respiratory: Negative for cough and shortness of breath.   Cardiovascular: Negative for chest pain and leg swelling.  Gastrointestinal: Negative for nausea, vomiting, abdominal pain and diarrhea.  Genitourinary: Positive for flank pain and testicular pain. Negative for hematuria.  Musculoskeletal: Negative for gait problem and neck pain.  Skin: Negative for color change.  Neurological: Negative for numbness and headaches.  Hematological: Negative for adenopathy.  Psychiatric/Behavioral: Negative for behavioral problems.  All other systems reviewed and are  negative.     Allergies  Review of patient's allergies indicates no known allergies.  Home Medications   Prior to Admission medications   Medication Sig Start Date End Date Taking? Authorizing Provider  omeprazole (PRILOSEC) 20 MG capsule Take 2 capsules (40 mg total) by mouth daily. 04/30/14   Blane Ohara, MD  traMADol (ULTRAM) 50 MG tablet Take 1 tablet (50 mg total) by mouth every 6 (six) hours as needed. 04/30/14   Blane Ohara, MD   BP 141/78 mmHg  Pulse 74  Temp(Src) 98 F (36.7 C)  Resp 18  SpO2 99% Physical Exam  Constitutional: He is oriented to person, place, and time. He appears well-developed and well-nourished.  HENT:  Head: Normocephalic and atraumatic.  Right Ear: External ear normal.  Left Ear: External ear normal.  Nose: Nose normal.  Mouth/Throat: Oropharynx is clear and moist. No oropharyngeal exudate.  Eyes: Conjunctivae and EOM are normal. Pupils are equal, round, and reactive to light.  Neck: Normal range of motion. Neck supple.  Cardiovascular: Normal rate, regular rhythm, normal heart sounds and intact distal pulses.  Exam reveals no gallop and no friction rub.   No murmur heard. Pulmonary/Chest: Effort normal and breath sounds normal. No respiratory distress. He has no wheezes.  Abdominal: Soft. Bowel sounds are normal. He exhibits no distension. There is no tenderness. There is no rebound and no guarding.  Musculoskeletal: Normal range of motion. He exhibits no edema or tenderness.  Neurological: He is alert and oriented to person, place, and time.  Skin: Skin is warm and dry.  Psychiatric: He has a normal mood and affect. His behavior is normal.  Nursing note and vitals reviewed.  ED Course  Procedures (including critical care time) Labs Review Labs Reviewed  URINALYSIS, ROUTINE W REFLEX MICROSCOPIC (NOT AT [facility]) - Abnormal; Notable for the following:    Color, Urine AMBER (*)    Hgb urine dipstick LARGE (*)    All other components  within normal limits  URINE MICROSCOPIC-ADD ON - Abnormal; Notable for the following:    Bacteria, UA FEW (*)    All other components within normal limits  URINE CULTURE  CBC  BASIC METABOLIC PANEL    Imaging Review No results found.   EKG Interpretation None      MDM   Final diagnoses:  Left flank pain    7:03 AM 34 y.o. male with self-reported history of kidney stones who presents with left flank pain which she initially noticed 6 days ago. He denies any fever, vomiting, or diarrhea. He states the pain has been intermittent but worsened this morning. The pain radiates from his left flank down to his left groin and testicle. He states this is consistent with all previous episodes of renal colic. He states that he typically goes to [facility] as we have no visits for kidney stones in our system. We'll get screening labs and symptom control. Given long history of kidney stones, do not think imaging is needed.  I offered a testicular and genital exam due to the pain radiation to the left testicle. The patient declined and states that his kidney stone pain is always consistent with this. He understands that w/out examination I cannot assess for a testicular torsion.   11:30 AM: UA w/ few bact. Will cover w/ keflex. Likely reactive, will send cx. Pain controlled.  I have discussed the diagnosis/risks/treatment options with the patient and believe the pt to be eligible for discharge home to follow-up with urology. We also discussed returning to the ED immediately if new or worsening sx occur. We discussed the sx which are most concerning (e.g., worsening pain, fever) that necessitate immediate return. Medications administered to the patient during their visit and any new prescriptions provided to the patient are listed below.  Medications given during this visit Medications  ketorolac (TORADOL) 30 MG/ML injection 30 mg (30 mg Intravenous Given 10/08/14 0651)  ondansetron (ZOFRAN)  injection 4 mg (4 mg Intravenous Given 10/08/14 0651)  sodium chloride 0.9 % bolus 1,000 mL (0 mLs Intravenous Stopped 10/08/14 0826)  HYDROmorphone (DILAUDID) injection 1 mg (1 mg Intravenous Given 10/08/14 0708)  HYDROmorphone (DILAUDID) injection 1 mg (1 mg Intravenous Given 10/08/14 0826)  sodium chloride 0.9 % bolus 1,000 mL (0 mLs Intravenous Stopped 10/08/14 1105)  HYDROmorphone (DILAUDID) injection 1 mg (1 mg Intravenous Given 10/08/14 1108)    New Prescriptions   CEPHALEXIN (KEFLEX) 500 MG CAPSULE    Take 1 capsule (500 mg total) by mouth 3 (three) times daily.   OXYCODONE-ACETAMINOPHEN (PERCOCET) 5-325 MG PER TABLET    Take 1-2 tablets by mouth every 6 (six) hours as needed.   TAMSULOSIN (FLOMAX) 0.4 MG CAPS CAPSULE    Take 1 capsule (0.4 mg total) by mouth daily after breakfast.     Purvis SheffieldForrest Kirby Argueta, MD 10/08/14 1130

## 2014-10-08 NOTE — ED Notes (Signed)
Pt leaving with driver , instructed not to drive x 6 hrs , verbal understanding

## 2014-10-10 LAB — URINE CULTURE
CULTURE: NO GROWTH
Colony Count: NO GROWTH

## 2015-07-01 ENCOUNTER — Emergency Department (HOSPITAL_BASED_OUTPATIENT_CLINIC_OR_DEPARTMENT_OTHER)
Admission: EM | Admit: 2015-07-01 | Discharge: 2015-07-01 | Disposition: A | Discharge status: Home / Self Care | Payer: Managed Care, Other (non HMO) | Attending: Physician Assistant | Admitting: Physician Assistant

## 2015-07-01 ENCOUNTER — Encounter (HOSPITAL_BASED_OUTPATIENT_CLINIC_OR_DEPARTMENT_OTHER): Payer: Self-pay | Admitting: *Deleted

## 2015-07-01 ENCOUNTER — Emergency Department (HOSPITAL_BASED_OUTPATIENT_CLINIC_OR_DEPARTMENT_OTHER): Payer: Managed Care, Other (non HMO)

## 2015-07-01 DIAGNOSIS — Z79899 Other long term (current) drug therapy: Secondary | ICD-10-CM | POA: Insufficient documentation

## 2015-07-01 DIAGNOSIS — R109 Unspecified abdominal pain: Secondary | ICD-10-CM | POA: Diagnosis present

## 2015-07-01 DIAGNOSIS — N200 Calculus of kidney: Secondary | ICD-10-CM | POA: Insufficient documentation

## 2015-07-01 LAB — URINALYSIS, ROUTINE W REFLEX MICROSCOPIC
BILIRUBIN URINE: NEGATIVE
Glucose, UA: NEGATIVE mg/dL
Hgb urine dipstick: NEGATIVE
KETONES UR: NEGATIVE mg/dL
LEUKOCYTES UA: NEGATIVE
NITRITE: NEGATIVE
PROTEIN: NEGATIVE mg/dL
Specific Gravity, Urine: 1.022 (ref 1.005–1.030)
pH: 7.5 (ref 5.0–8.0)

## 2015-07-01 MED ORDER — TAMSULOSIN HCL 0.4 MG PO CAPS: 0.4000 mg | ORAL_CAPSULE | Freq: Every day | ORAL | Status: DC

## 2015-07-01 MED ORDER — KETOROLAC TROMETHAMINE 30 MG/ML IJ SOLN
30.0000 mg | Freq: Once | INTRAMUSCULAR | Status: AC
  Administered 2015-07-01: 30 mg via INTRAVENOUS
  Filled 2015-07-01: qty 1

## 2015-07-01 MED ORDER — ONDANSETRON HCL 4 MG/2ML IJ SOLN: 4.0000 mg | Freq: Once | INTRAMUSCULAR | Status: DC

## 2015-07-01 MED ORDER — ONDANSETRON HCL 8 MG PO TABS
4.0000 mg | ORAL_TABLET | Freq: Once | ORAL | Status: AC
  Administered 2015-07-01: 4 mg via ORAL
  Filled 2015-07-01: qty 1

## 2015-07-01 MED ORDER — SODIUM CHLORIDE 0.9 % IV BOLUS (SEPSIS)
1000.0000 mL | Freq: Once | INTRAVENOUS | Status: AC
  Administered 2015-07-01: 1000 mL via INTRAVENOUS

## 2015-07-01 MED ORDER — OXYCODONE-ACETAMINOPHEN 5-325 MG PO TABS
1.0000 | ORAL_TABLET | Freq: Once | ORAL | Status: AC
  Administered 2015-07-01: 1 via ORAL
  Filled 2015-07-01: qty 1

## 2015-07-01 MED ORDER — ONDANSETRON HCL 4 MG PO TABS: 4.0000 mg | ORAL_TABLET | Freq: Three times a day (TID) | ORAL | Status: DC | PRN

## 2015-07-01 MED ORDER — MORPHINE SULFATE (PF) 4 MG/ML IV SOLN
4.0000 mg | Freq: Once | INTRAVENOUS | Status: AC
  Administered 2015-07-01: 4 mg via INTRAVENOUS
  Filled 2015-07-01: qty 1

## 2015-07-01 MED ORDER — OXYCODONE-ACETAMINOPHEN 5-325 MG PO TABS: 1.0000 | ORAL_TABLET | Freq: Four times a day (QID) | ORAL | Status: DC | PRN

## 2015-07-01 MED FILL — OXYCODONE/APAP 5-325: 5-325 | 2 days supply | Qty: 11 | Fill #0

## 2015-07-01 MED FILL — TAMSULOSIN HCL 0.4 MG CAP: 0.4 | 30 days supply | Qty: 30 | Fill #0

## 2015-07-01 MED FILL — ONDANSETRON HCL 4 MG TABLET: 4 | 4 days supply | Qty: 9 | Fill #0

## 2015-07-01 NOTE — ED Provider Notes (Addendum)
CSN: [id number]     Arrival date & time 07/01/15  1051 History   First MD Initiated Contact with Patient 07/01/15 1103     Chief Complaint  Patient presents with  . Flank Pain     (Consider location/radiation/quality/duration/timing/severity/associated sxs/prior Treatment) HPI   Patient is a 35 year old male with history of kidney stones presenting with left flank pain. He developed flank pain several hours prior to arrival. Patient's not taking anything for pain to prior. Patient has no primary care physician and no urologist.  No fevers. No urinary symptoms.  Patient states he has left flank pain radiates down into his groin. He states it feels exactly similar to his previous stones.  We do not have any evidence of prior stones on images here in our system.  Past Medical History  Diagnosis Date  . Kidney stones    History reviewed. No pertinent past surgical history. No family history on file. Social History  Substance Use Topics  . Smoking status: Never Smoker   . Smokeless tobacco: None  . Alcohol Use: No    Review of Systems  Constitutional: Negative for activity change.  HENT: Negative for congestion.   Respiratory: Negative for shortness of breath.   Cardiovascular: Negative for chest pain.  Gastrointestinal: Negative for abdominal pain.  Genitourinary: Positive for flank pain and penile pain. Negative for frequency, hematuria and penile swelling.  Musculoskeletal: Positive for back pain.  Neurological: Negative for dizziness.  Psychiatric/Behavioral: Negative for agitation.      Allergies  Review of patient's allergies indicates no known allergies.  Home Medications   Prior to Admission medications   Medication Sig Start Date End Date Taking? Authorizing Provider  omeprazole (PRILOSEC) 20 MG capsule Take 2 capsules (40 mg total) by mouth daily. 04/30/14   Blane Ohara, MD  oxyCODONE-acetaminophen (PERCOCET) 5-325 MG per tablet Take 1-2 tablets by mouth  every 6 (six) hours as needed. 10/08/14   Purvis Sheffield, MD  tamsulosin (FLOMAX) 0.4 MG CAPS capsule Take 1 capsule (0.4 mg total) by mouth daily after breakfast. 10/08/14   Purvis Sheffield, MD  traMADol (ULTRAM) 50 MG tablet Take 1 tablet (50 mg total) by mouth every 6 (six) hours as needed. 04/30/14   Blane Ohara, MD   BP 151/90 mmHg  Pulse 91  Temp(Src) 98.8 F (37.1 C) (Oral)  Resp 18  Ht  (1.702 m)  Wt 200 lb (90.719 kg)  BMI 31.32 kg/m2  SpO2 98% Physical Exam  Constitutional: He is oriented to person, place, and time. He appears well-nourished.  HENT:  Head: Normocephalic.  Mouth/Throat: Oropharynx is clear and moist.  Eyes: Conjunctivae are normal.  Neck: No tracheal deviation present.  Cardiovascular: Normal rate.   Pulmonary/Chest: Effort normal. No stridor. No respiratory distress.  Abdominal: Soft. There is no tenderness. There is no guarding.  No pain to palpation or pain to flank.  Musculoskeletal: Normal range of motion. He exhibits no edema.  Neurological: He is oriented to person, place, and time. No cranial nerve deficit.  Skin: Skin is warm and dry. No rash noted. He is not diaphoretic.  Psychiatric: He has a normal mood and affect. His behavior is normal.  Nursing note and vitals reviewed.   ED Course  Procedures (including critical care time) Labs Review Labs Reviewed  URINALYSIS, ROUTINE W REFLEX MICROSCOPIC (NOT AT [facility]) - Abnormal; Notable for the following:    Color, Urine AMBER (*)    All other components within normal limits    Imaging  Review Ct Renal Stone Study  07/01/2015  CLINICAL DATA:  Left flank pain since this morning. EXAM: CT ABDOMEN AND PELVIS WITHOUT CONTRAST TECHNIQUE: Multidetector CT imaging of the abdomen and pelvis was performed following the standard protocol without IV contrast. COMPARISON:  None. FINDINGS: Lower chest:  Lung bases are clear.  Normal heart size. Hepatobiliary: Normal liver.  Normal gallbladder. Pancreas:  Normal. Spleen: Normal. Adrenals/Urinary Tract: Normal adrenal glands. Bilateral nonobstructing nephrolithiasis. No ureterolithiasis. Normal decompressed bladder. Stomach/Bowel: No bowel wall thickening or dilatation. No pneumatosis, pneumoperitoneum or portal venous gas. Normal appendix. No abdominal or pelvic free fluid. Vascular/Lymphatic: Normal caliber abdominal aorta. No lymphadenopathy. Other: No fluid collection or hematoma. Musculoskeletal: No lytic or sclerotic osseous lesion. No acute osseous abnormality. IMPRESSION: 1. Bilateral nonobstructing nephrolithiasis. 2. Normal appendix. Electronically Signed   By: Elige Ko   On: 07/01/2015 12:09   I have personally reviewed and evaluated these images and lab results as part of my medical decision-making.   EKG Interpretation None      MDM   Final diagnoses:  None    Patient is a 35 year old male otherwise healthy with history of kidney stones presenting today with left flank pain. Patient's urine does not show any hematuria. This is atypical if the pain were stones. We'll do CT stone to confirm that stones are the origin of his pain. Patient's urine does not show any infection. He appears in NAD.  2:43 PM CT shows stones. WIll give ketoralac. No fevers or infection in urine. T[place]arting PO  Jamarria Real Randall An, MD 07/01/15 1313  Pervis Macintyre Randall An, MD 07/01/15 1444

## 2015-07-01 NOTE — ED Notes (Signed)
Patient transported to CT. Ambulatory with steady gait

## 2015-07-01 NOTE — ED Notes (Signed)
Left flank pain x 2 hours. Pain goes into his penis.

## 2015-07-01 NOTE — Discharge Instructions (Signed)
Kidney Stones °Kidney stones (urolithiasis) are deposits that form inside your kidneys. The intense pain is caused by the stone moving through the urinary tract. When the stone moves, the ureter goes into spasm around the stone. The stone is usually passed in the urine.  °CAUSES  °· A disorder that makes certain neck glands produce too much parathyroid hormone (primary hyperparathyroidism). °· A buildup of uric acid crystals, similar to gout in your joints. °· Narrowing (stricture) of the ureter. °· A kidney obstruction present at birth (congenital obstruction). °· Previous surgery on the kidney or ureters. °· Numerous kidney infections. °SYMPTOMS  °· Feeling sick to your stomach (nauseous). °· Throwing up (vomiting). °· Blood in the urine (hematuria). °· Pain that usually spreads (radiates) to the groin. °· Frequency or urgency of urination. °DIAGNOSIS  °· Taking a history and physical exam. °· Blood or urine tests. °· CT scan. °· Occasionally, an examination of the inside of the urinary bladder (cystoscopy) is performed. °TREATMENT  °· Observation. °· Increasing your fluid intake. °· Extracorporeal shock wave lithotripsy--This is a noninvasive procedure that uses shock waves to break up kidney stones. °· Surgery may be needed if you have severe pain or persistent obstruction. There are various surgical procedures. Most of the procedures are performed with the use of small instruments. Only small incisions are needed to accommodate these instruments, so recovery time is minimized. °The size, location, and chemical composition are all important variables that will determine the proper choice of action for you. Talk to your health care provider to better understand your situation so that you will minimize the risk of injury to yourself and your kidney.  °HOME CARE INSTRUCTIONS  °· Drink enough water and fluids to keep your urine clear or pale yellow. This will help you to pass the stone or stone fragments. °· Strain  all urine through the provided strainer. Keep all particulate matter and stones for your health care provider to see. The stone causing the pain may be as small as a grain of salt. It is very important to use the strainer each and every time you pass your urine. The collection of your stone will allow your health care provider to analyze it and verify that a stone has actually passed. The stone analysis will often identify what you can do to reduce the incidence of recurrences. °· Only take over-the-counter or prescription medicines for pain, discomfort, or fever as directed by your health care provider. °· Keep all follow-up visits as told by your health care provider. This is important. °· Get follow-up X-rays if required. The absence of pain does not always mean that the stone has passed. It may have only stopped moving. If the urine remains completely obstructed, it can cause loss of kidney function or even complete destruction of the kidney. It is your responsibility to make sure X-rays and follow-ups are completed. Ultrasounds of the kidney can show blockages and the status of the kidney. Ultrasounds are not associated with any radiation and can be performed easily in a matter of minutes. °· Make changes to your daily diet as told by your health care provider. You may be told to: °¨ Limit the amount of salt that you eat. °¨ Eat 5 or more servings of fruits and vegetables each day. °¨ Limit the amount of meat, poultry, fish, and eggs that you eat. °· Collect a 24-hour urine sample as told by your health care provider. You may need to collect another urine sample every 6-12   months. °SEEK MEDICAL CARE IF: °· You experience pain that is progressive and unresponsive to any pain medicine you have been prescribed. °SEEK IMMEDIATE MEDICAL CARE IF:  °· Pain cannot be controlled with the prescribed medicine. °· You have a fever or shaking chills. °· The severity or intensity of pain increases over 18 hours and is not  relieved by pain medicine. °· You develop a new onset of abdominal pain. °· You feel faint or pass out. °· You are unable to urinate. °  °This information is not intended to replace advice given to you by your health care provider. Make sure you discuss any questions you have with your health care provider. °  °Document Released: 04/19/2005 Document Revised: 01/08/2015 Document Reviewed: 09/20/2012 °Elsevier Interactive Patient Education ©2016 [employer] ° °

## 2015-07-14 ENCOUNTER — Encounter (HOSPITAL_BASED_OUTPATIENT_CLINIC_OR_DEPARTMENT_OTHER): Payer: Self-pay | Admitting: *Deleted

## 2015-07-14 DIAGNOSIS — R51 Headache: Secondary | ICD-10-CM | POA: Insufficient documentation

## 2015-07-14 DIAGNOSIS — Z87442 Personal history of urinary calculi: Secondary | ICD-10-CM | POA: Diagnosis not present

## 2015-07-14 DIAGNOSIS — Z8679 Personal history of other diseases of the circulatory system: Secondary | ICD-10-CM | POA: Diagnosis not present

## 2015-07-14 DIAGNOSIS — H53149 Visual discomfort, unspecified: Secondary | ICD-10-CM | POA: Diagnosis not present

## 2015-07-14 DIAGNOSIS — R112 Nausea with vomiting, unspecified: Secondary | ICD-10-CM | POA: Diagnosis not present

## 2015-07-14 NOTE — ED Notes (Signed)
Pt woke up with a migraine.  Reports hx of same. Vomit x 1 PTA

## 2015-07-15 ENCOUNTER — Emergency Department (HOSPITAL_BASED_OUTPATIENT_CLINIC_OR_DEPARTMENT_OTHER)
Admission: EM | Admit: 2015-07-15 | Discharge: 2015-07-15 | Disposition: A | Discharge status: Home / Self Care | Payer: Managed Care, Other (non HMO) | Attending: Emergency Medicine | Admitting: Emergency Medicine

## 2015-07-15 DIAGNOSIS — R51 Headache: Secondary | ICD-10-CM

## 2015-07-15 DIAGNOSIS — R519 Headache, unspecified: Secondary | ICD-10-CM

## 2015-07-15 HISTORY — DX: Migraine, unspecified, not intractable, without status migrainosus: G43.909

## 2015-07-15 MED ORDER — SODIUM CHLORIDE 0.9 % IV BOLUS (SEPSIS)
1000.0000 mL | Freq: Once | INTRAVENOUS | Status: AC
  Administered 2015-07-15: 1000 mL via INTRAVENOUS

## 2015-07-15 MED ORDER — METOCLOPRAMIDE HCL 5 MG/ML IJ SOLN
10.0000 mg | Freq: Once | INTRAMUSCULAR | Status: AC
  Administered 2015-07-15: 10 mg via INTRAVENOUS
  Filled 2015-07-15: qty 2

## 2015-07-15 MED ORDER — DIPHENHYDRAMINE HCL 50 MG/ML IJ SOLN
25.0000 mg | Freq: Once | INTRAMUSCULAR | Status: AC
Start: 2015-07-15 — End: 2015-07-15
  Administered 2015-07-15: 25 mg via INTRAVENOUS
  Filled 2015-07-15: qty 1

## 2015-07-15 MED ORDER — KETOROLAC TROMETHAMINE 30 MG/ML IJ SOLN
30.0000 mg | Freq: Once | INTRAMUSCULAR | Status: AC
  Administered 2015-07-15: 30 mg via INTRAVENOUS
  Filled 2015-07-15: qty 1

## 2015-07-15 NOTE — ED Provider Notes (Signed)
CSN: [id number]     Arrival date & time 07/14/15  2204 History   First MD Initiated Contact with Patient 07/15/15 0211     Chief Complaint  Patient presents with  . Migraine     (Consider location/radiation/quality/duration/timing/severity/associated sxs/prior Treatment) HPI  This is a 35 year old male with a history of migraines. He is here with a migraine that began yesterday morning. The onset was gradual. The pain is become severe. It is located throughout the head. It has been associated with photophobia, nausea and one episode of vomiting. He tried taking nortriptyline yesterday afternoon without relief.  Past Medical History  Diagnosis Date  . Kidney stones   . Migraine    History reviewed. No pertinent past surgical history. History reviewed. No pertinent family history. Social History  Substance Use Topics  . Smoking status: Never Smoker   . Smokeless tobacco: None  . Alcohol Use: No    Review of Systems  All other systems reviewed and are negative.   Allergies  Review of patient's allergies indicates no known allergies.  Home Medications   Prior to Admission medications   Not on File   BP 152/97 mmHg  Pulse 80  Temp(Src) 98.4 F (36.9 C) (Oral)  Resp 18  Ht 5\' 7"  (1.702 m)  Wt 200 lb (90.719 kg)  BMI 31.32 kg/m2  SpO2 100%   Physical Exam  General: Well-developed, well-nourished male in no acute distress; appearance consistent with age of record HENT: normocephalic; atraumatic Eyes: pupils equal, round and reactive to light; extraocular muscles intact; photophobia Neck: supple Heart: regular rate and rhythm Lungs: clear to auscultation bilaterally Abdomen: soft; nondistended; nontender; no masses or hepatosplenomegaly; bowel sounds present Extremities: No deformity; full range of motion; pulses normal Neurologic: Awake, alert; motor function intact in all extremities and symmetric; no facial droop Skin: Warm and dry Psychiatric: Flat  affect    ED Course  Procedures (including critical care time)   MDM  3:51 AM Patient significantly improved after IV fluids and medications.  Paula LibraJohn Galia Rahm, MD 07/15/15 [id number]

## 2015-07-15 NOTE — ED Notes (Signed)
Pt verbalizes understanding of d/c instructions and denies any further needs at this time. 

## 2015-07-15 NOTE — ED Notes (Signed)
Pt c/o worsening head pain since this morning with photosensitivity and n/v.  He saw a neurologist and was prescribed nortryptaline.  He took the medication at 1500 and his head pain got worse.

## 2015-11-03 ENCOUNTER — Emergency Department (HOSPITAL_BASED_OUTPATIENT_CLINIC_OR_DEPARTMENT_OTHER)
Admission: EM | Admit: 2015-11-03 | Discharge: 2015-11-03 | Disposition: A | Discharge status: Home / Self Care | Payer: Managed Care, Other (non HMO) | Attending: Emergency Medicine | Admitting: Emergency Medicine

## 2015-11-03 ENCOUNTER — Emergency Department (HOSPITAL_BASED_OUTPATIENT_CLINIC_OR_DEPARTMENT_OTHER): Payer: Managed Care, Other (non HMO)

## 2015-11-03 ENCOUNTER — Encounter (HOSPITAL_BASED_OUTPATIENT_CLINIC_OR_DEPARTMENT_OTHER): Payer: Self-pay | Admitting: *Deleted

## 2015-11-03 DIAGNOSIS — N200 Calculus of kidney: Secondary | ICD-10-CM | POA: Insufficient documentation

## 2015-11-03 DIAGNOSIS — R52 Pain, unspecified: Secondary | ICD-10-CM

## 2015-11-03 LAB — URINALYSIS, ROUTINE W REFLEX MICROSCOPIC
Bilirubin Urine: NEGATIVE
Glucose, UA: NEGATIVE mg/dL
Ketones, ur: NEGATIVE mg/dL
LEUKOCYTES UA: NEGATIVE
NITRITE: NEGATIVE
Protein, ur: NEGATIVE mg/dL
SPECIFIC GRAVITY, URINE: 1.023 (ref 1.005–1.030)
pH: 6 (ref 5.0–8.0)

## 2015-11-03 LAB — URINE MICROSCOPIC-ADD ON: WBC, UA: NONE SEEN WBC/hpf (ref 0–5)

## 2015-11-03 LAB — BASIC METABOLIC PANEL
ANION GAP: 8 (ref 5–15)
BUN: 15 mg/dL (ref 6–20)
CALCIUM: 9.3 mg/dL (ref 8.9–10.3)
CO2: 28 mmol/L (ref 22–32)
Chloride: 103 mmol/L (ref 101–111)
Creatinine, Ser: 0.94 mg/dL (ref 0.61–1.24)
GFR calc Af Amer: >60 mL/min (ref >60)
GLUCOSE: 100 mg/dL — AB (ref 65–99)
POTASSIUM: 3.7 mmol/L (ref 3.5–5.1)
SODIUM: 139 mmol/L (ref 135–145)

## 2015-11-03 LAB — CBC WITH DIFFERENTIAL/PLATELET
BASOS ABS: 0 10*3/uL (ref 0.0–0.1)
Basophils Relative: 0 %
EOS ABS: 0.3 10*3/uL (ref 0.0–0.7)
EOS PCT: 3 %
HCT: 40.9 % (ref 39.0–52.0)
Hemoglobin: 14.2 g/dL (ref 13.0–17.0)
LYMPHS PCT: 40 %
Lymphs Abs: 4.5 10*3/uL — ABNORMAL HIGH (ref 0.7–4.0)
MCH: 31.1 pg (ref 26.0–34.0)
MCHC: 34.7 g/dL (ref 30.0–36.0)
MCV: 89.5 fL (ref 78.0–100.0)
MONO ABS: 1.2 10*3/uL — AB (ref 0.1–1.0)
Monocytes Relative: 11 %
Neutro Abs: 5.2 10*3/uL (ref 1.7–7.7)
Neutrophils Relative %: 46 %
PLATELETS: 299 10*3/uL (ref 150–400)
RBC: 4.57 MIL/uL (ref 4.22–5.81)
RDW: 12.2 % (ref 11.5–15.5)
WBC: 11.2 10*3/uL — AB (ref 4.0–10.5)

## 2015-11-03 MED ORDER — MORPHINE SULFATE (PF) 4 MG/ML IV SOLN
4.0000 mg | Freq: Once | INTRAVENOUS | Status: AC
  Administered 2015-11-03: 4 mg via INTRAVENOUS
  Filled 2015-11-03: qty 1

## 2015-11-03 MED ORDER — KETOROLAC TROMETHAMINE 30 MG/ML IJ SOLN
30.0000 mg | Freq: Once | INTRAMUSCULAR | Status: AC
  Administered 2015-11-03: 30 mg via INTRAVENOUS
  Filled 2015-11-03: qty 1

## 2015-11-03 MED ORDER — SODIUM CHLORIDE 0.9 % IV BOLUS (SEPSIS)
1000.0000 mL | Freq: Once | INTRAVENOUS | Status: AC
Start: 2015-11-03 — End: 2015-11-03
  Administered 2015-11-03: 1000 mL via INTRAVENOUS

## 2015-11-03 MED ORDER — TAMSULOSIN HCL 0.4 MG PO CAPS: 0.4000 mg | ORAL_CAPSULE | Freq: Every day | ORAL | Status: DC

## 2015-11-03 MED ORDER — ONDANSETRON 8 MG PO TBDP: ORAL_TABLET | ORAL | Status: DC

## 2015-11-03 MED ORDER — ONDANSETRON HCL 4 MG/2ML IJ SOLN
4.0000 mg | Freq: Once | INTRAMUSCULAR | Status: AC
  Administered 2015-11-03: 4 mg via INTRAVENOUS
  Filled 2015-11-03: qty 2

## 2015-11-03 MED ORDER — TAMSULOSIN HCL 0.4 MG PO CAPS
0.4000 mg | ORAL_CAPSULE | Freq: Every day | ORAL | Status: DC
  Administered 2015-11-03: 0.4 mg via ORAL
  Filled 2015-11-03: qty 1

## 2015-11-03 MED ORDER — OXYCODONE-ACETAMINOPHEN 5-325 MG PO TABS: 1.0000 | ORAL_TABLET | Freq: Four times a day (QID) | ORAL | Status: DC | PRN

## 2015-11-03 MED ORDER — FENTANYL CITRATE (PF) 100 MCG/2ML IJ SOLN: 100.0000 ug | Freq: Once | INTRAMUSCULAR | Status: DC

## 2015-11-03 MED ORDER — DICLOFENAC SODIUM ER 100 MG PO TB24: 100.0000 mg | ORAL_TABLET | Freq: Every day | ORAL | Status: DC

## 2015-11-03 NOTE — ED Notes (Signed)
pending family/ ride arrival before pain med is given, pt on phone calling family.

## 2015-11-03 NOTE — ED Notes (Signed)
Back from CT

## 2015-11-03 NOTE — ED Notes (Signed)
Per pt report sudden lt flank and lt lower abdominal pain that woke hi up from sleep.

## 2015-11-03 NOTE — ED Notes (Signed)
Back from CT, alert, NAD, calm, interactive.  

## 2015-11-03 NOTE — ED Provider Notes (Signed)
CSN: [id number]     Arrival date & time 11/03/15  0221 History   First MD Initiated Contact with Patient 11/03/15 0308     Chief Complaint  Patient presents with  . Flank Pain     (Consider location/radiation/quality/duration/timing/severity/associated sxs/prior Treatment) Patient is a 35 y.o. male presenting with flank pain. The history is provided by the patient.  Flank Pain This is a recurrent problem. The current episode started less than 1 hour ago. The problem occurs constantly. The problem has not changed since onset.Pertinent negatives include no chest pain, no abdominal pain, no headaches and no shortness of breath. Nothing aggravates the symptoms. Nothing relieves the symptoms. He has tried nothing for the symptoms. The treatment provided no relief.    Past Medical History  Diagnosis Date  . Kidney stones   . Migraine    History reviewed. No pertinent past surgical history. History reviewed. No pertinent family history. Social History  Substance Use Topics  . Smoking status: Never Smoker   . Smokeless tobacco: None  . Alcohol Use: No    Review of Systems  Constitutional: Negative for fever.  Respiratory: Negative for shortness of breath.   Cardiovascular: Negative for chest pain.  Gastrointestinal: Negative for nausea, vomiting, abdominal pain and diarrhea.  Genitourinary: Positive for flank pain. Negative for dysuria and hematuria.  Neurological: Negative for headaches.  All other systems reviewed and are negative.     Allergies  Review of patient's allergies indicates no known allergies.  Home Medications   Prior to Admission medications   Not on File   BP 123/84 mmHg  Pulse 81  Temp(Src) 98.3 F (36.8 C) (Oral)  Resp 24  Ht 5\' 7"  (1.702 m)  Wt 195 lb (88.451 kg)  BMI 30.53 kg/m2  SpO2 100% Physical Exam  Constitutional: He is oriented to person, place, and time. He appears well-developed and well-nourished. No distress.  HENT:  Head:  Normocephalic and atraumatic.  Mouth/Throat: Oropharynx is clear and moist.  Eyes: Conjunctivae are normal. Pupils are equal, round, and reactive to light.  Neck: Normal range of motion. Neck supple.  Cardiovascular: Normal rate, regular rhythm and intact distal pulses.   Pulmonary/Chest: Effort normal and breath sounds normal. He has no wheezes. He has no rales.  Abdominal: Soft. Bowel sounds are normal. There is no tenderness. There is no rebound and no guarding.  Musculoskeletal: Normal range of motion.  Neurological: He is alert and oriented to person, place, and time.  Skin: Skin is warm and dry.  Psychiatric: He has a normal mood and affect.    ED Course  Procedures (including critical care time) Labs Review Labs Reviewed  URINALYSIS, ROUTINE W REFLEX MICROSCOPIC (NOT AT [facility]) - Abnormal; Notable for the following:    APPearance CLOUDY (*)    Hgb urine dipstick LARGE (*)    All other components within normal limits  CBC WITH DIFFERENTIAL/PLATELET - Abnormal; Notable for the following:    WBC 11.2 (*)    Lymphs Abs 4.5 (*)    Monocytes Absolute 1.2 (*)    All other components within normal limits  BASIC METABOLIC PANEL - Abnormal; Notable for the following:    Glucose, Bld 100 (*)    All other components within normal limits  URINE MICROSCOPIC-ADD ON - Abnormal; Notable for the following:    Squamous Epithelial / LPF 0-5 (*)    Bacteria, UA RARE (*)    Crystals CA OXALATE CRYSTALS (*)    All other components within normal  limits  URINALYSIS, ROUTINE W REFLEX MICROSCOPIC (NOT AT [facility])    Imaging Review No results found. I have personally reviewed and evaluated these images and lab results as part of my medical decision-making.   EKG Interpretation None      MDM   Final diagnoses:  Pain   Filed Vitals:   11/03/15 0227  BP: 123/84  Pulse: 81  Temp: 98.3 F (36.8 C)  Resp: 24   Results for orders placed or performed during the hospital encounter of 11/03/15   Urinalysis, Routine w reflex microscopic (not at [facility])  Result Value Ref Range   Color, Urine YELLOW YELLOW   APPearance CLOUDY (A) CLEAR   Specific Gravity, Urine 1.023 1.005 - 1.030   pH 6.0 5.0 - 8.0   Glucose, UA NEGATIVE NEGATIVE mg/dL   Hgb urine dipstick LARGE (A) NEGATIVE   Bilirubin Urine NEGATIVE NEGATIVE   Ketones, ur NEGATIVE NEGATIVE mg/dL   Protein, ur NEGATIVE NEGATIVE mg/dL   Nitrite NEGATIVE NEGATIVE   Leukocytes, UA NEGATIVE NEGATIVE  CBC with Differential/Platelet  Result Value Ref Range   WBC 11.2 (H) 4.0 - 10.5 K/uL   RBC 4.57 4.22 - 5.81 MIL/uL   Hemoglobin 14.2 13.0 - 17.0 g/dL   HCT 16.1 09.6 - 04.5 %   MCV 89.5 78.0 - 100.0 fL   MCH 31.1 26.0 - 34.0 pg   MCHC 34.7 30.0 - 36.0 g/dL   RDW 40.9 81.1 - 91.4 %   Platelets 299 150 - 400 K/uL   Neutrophils Relative % 46 %   Neutro Abs 5.2 1.7 - 7.7 K/uL   Lymphocytes Relative 40 %   Lymphs Abs 4.5 (H) 0.7 - 4.0 K/uL   Monocytes Relative 11 %   Monocytes Absolute 1.2 (H) 0.1 - 1.0 K/uL   Eosinophils Relative 3 %   Eosinophils Absolute 0.3 0.0 - 0.7 K/uL   Basophils Relative 0 %   Basophils Absolute 0.0 0.0 - 0.1 K/uL  Basic metabolic panel  Result Value Ref Range   Sodium 139 135 - 145 mmol/L   Potassium 3.7 3.5 - 5.1 mmol/L   Chloride 103 101 - 111 mmol/L   CO2 28 22 - 32 mmol/L   Glucose, Bld 100 (H) 65 - 99 mg/dL   BUN 15 6 - 20 mg/dL   Creatinine, Ser 7.82 0.61 - 1.24 mg/dL   Calcium 9.3 8.9 - 95.6 mg/dL   GFR calc non Af Amer >60 >60 mL/min   GFR calc Af Amer >60 >60 mL/min   Anion gap 8 5 - 15  Urine microscopic-add on  Result Value Ref Range   Squamous Epithelial / LPF 0-5 (A) NONE SEEN   WBC, UA NONE SEEN 0 - 5 WBC/hpf   RBC / HPF TOO NUMEROUS TO COUNT 0 - 5 RBC/hpf   Bacteria, UA RARE (A) NONE SEEN   Crystals CA OXALATE CRYSTALS (A) NEGATIVE   No results found.  Medications  tamsulosin (FLOMAX) capsule 0.4 mg (0.4 mg Oral Given 11/03/15 0448)  ketorolac (TORADOL) 30 MG/ML injection  30 mg (30 mg Intravenous Given 11/03/15 0253)  ondansetron (ZOFRAN) injection 4 mg (4 mg Intravenous Given 11/03/15 0253)  sodium chloride 0.9 % bolus 1,000 mL (0 mLs Intravenous Stopped 11/03/15 0447)  morphine 4 MG/ML injection 4 mg (4 mg Intravenous Given 11/03/15 0607)   Family is present to drive patient home post opioid pain medication  Strain all urine.  Take nausea medication prior to taking your pain medication and  flomax. Do not drink alcohol drive a car or operate heavy machinery while taking pain medication.   Make an appointment for 1 weeks time with urology.  Patietn verbalizes understanding and agrees to follow up.  Strict return precautions given     Gamble Enderle, MD 11/03/15 [phone]

## 2015-11-07 ENCOUNTER — Encounter (HOSPITAL_BASED_OUTPATIENT_CLINIC_OR_DEPARTMENT_OTHER): Payer: Self-pay

## 2015-11-07 ENCOUNTER — Emergency Department (HOSPITAL_BASED_OUTPATIENT_CLINIC_OR_DEPARTMENT_OTHER)
Admission: EM | Admit: 2015-11-07 | Discharge: 2015-11-07 | Disposition: A | Discharge status: Home / Self Care | Payer: Managed Care, Other (non HMO) | Attending: Emergency Medicine | Admitting: Emergency Medicine

## 2015-11-07 DIAGNOSIS — H00011 Hordeolum externum right upper eyelid: Secondary | ICD-10-CM | POA: Insufficient documentation

## 2015-11-07 DIAGNOSIS — H00013 Hordeolum externum right eye, unspecified eyelid: Secondary | ICD-10-CM

## 2015-11-07 DIAGNOSIS — R52 Pain, unspecified: Secondary | ICD-10-CM

## 2015-11-07 DIAGNOSIS — N23 Unspecified renal colic: Secondary | ICD-10-CM | POA: Insufficient documentation

## 2015-11-07 LAB — URINALYSIS, ROUTINE W REFLEX MICROSCOPIC
Bilirubin Urine: NEGATIVE
GLUCOSE, UA: NEGATIVE mg/dL
Ketones, ur: NEGATIVE mg/dL
LEUKOCYTES UA: NEGATIVE
Nitrite: NEGATIVE
PH: 5.5 (ref 5.0–8.0)
PROTEIN: NEGATIVE mg/dL
Specific Gravity, Urine: 1.011 (ref 1.005–1.030)

## 2015-11-07 LAB — URINE MICROSCOPIC-ADD ON

## 2015-11-07 MED ORDER — HYDROMORPHONE HCL 4 MG PO TABS: 2.0000 mg | ORAL_TABLET | ORAL | Status: DC | PRN

## 2015-11-07 MED ORDER — ERYTHROMYCIN 5 MG/GM OP OINT
TOPICAL_OINTMENT | Freq: Four times a day (QID) | OPHTHALMIC | Status: DC
  Administered 2015-11-07: 1 via OPHTHALMIC
  Filled 2015-11-07: qty 3.5

## 2015-11-07 NOTE — ED Notes (Signed)
Pt c/o left flank pain since Sunday, states he has a few pain pills left, but that he has not been able to sleep or "do anything for 4 or 5 days."  Pt walked into department in no apparent distress, texting on phone.

## 2015-11-07 NOTE — ED Notes (Signed)
Pt continues to c/o left flank pain r/t kidney stone.  He states he still has pain medication left and when asked if he needs more, he states, " I don't need anymore pain pills, I need to sleep."  Unsure as to how to help pt today.

## 2015-11-07 NOTE — ED Provider Notes (Signed)
CSN: [id number]     Arrival date & time 11/07/15  0423 History   First MD Initiated Contact with Patient 11/07/15 [phone]     Chief Complaint  Patient presents with  . Flank Pain     (Consider location/radiation/quality/duration/timing/severity/associated sxs/prior Treatment) HPI  This is a 35 year old male with history kidney stones. He was seen on July 3 for the acute onset of left flank pain. He was discharged home on diclofenac ER, Flomax, Zofran and Percocet 5/325 one every 6 hours as needed for pain. He returns with inadequately controlled pain. He's been taking his medications as instructed but states the pain is preventing him from sleeping or "doing anything". He denies fever. He rates his pain as a 9 out of 10 presently. He has not been able to get an appointment with urology yet.  He is also complaining of swelling and tenderness of the right upper eyelid.  Past Medical History  Diagnosis Date  . Kidney stones   . Migraine    History reviewed. No pertinent past surgical history. No family history on file. Social History  Substance Use Topics  . Smoking status: Never Smoker   . Smokeless tobacco: None  . Alcohol Use: No    Review of Systems  All other systems reviewed and are negative.   Allergies  Review of patient's allergies indicates no known allergies.  Home Medications   Prior to Admission medications   Medication Sig Start Date End Date Taking? Authorizing Provider  Diclofenac Sodium CR (VOLTAREN-XR) 100 MG 24 hr tablet Take 1 tablet (100 mg total) by mouth daily. 11/03/15   April Palumbo, MD  ondansetron [facility](ZOFRAN ODT) 8 MG disintegrating tablet 8mg  ODT q8 hours prn nausea 11/03/15   April Palumbo, MD  oxyCODONE-acetaminophen (PERCOCET) 5-325 MG tablet Take 1 tablet by mouth every 6 (six) hours as needed. 11/03/15   April Palumbo, MD  tamsulosin (FLOMAX) 0.4 MG CAPS capsule Take 1 capsule (0.4 mg total) by mouth daily. 11/03/15   April Palumbo, MD   BP 135/96 mmHg   Pulse 76  Temp(Src) 97.9 F (36.6 C) (Oral)  Resp 18  Ht 5\' 7"  (1.702 m)  Wt 195 lb (88.451 kg)  BMI 30.53 kg/m2  SpO2 100%   Physical Exam  General: Well-developed, well-nourished male in no acute distress; appearance consistent with age of record HENT: normocephalic; atraumatic Eyes: pupils equal, round and reactive to light; extraocular muscles intact; edema, erythema and tenderness of right upper eyelid Neck: supple Heart: regular rate and rhythm Lungs: clear to auscultation bilaterally Abdomen: soft; nondistended; mild left suprapubic tenderness; no masses or hepatosplenomegaly; bowel sounds present GU: No CVA tenderness Extremities: No deformity; full range of motion; pulses normal Neurologic: Awake, alert and oriented; motor function intact in all extremities and symmetric; no facial droop Skin: Warm and dry Psychiatric: Flat affect    ED Course  Procedures (including critical care time)   MDM  Nursing notes and vitals signs, including pulse oximetry, reviewed.  Summary of this visit's results, reviewed by myself:  Labs:  Results for orders placed or performed during the hospital encounter of 11/07/15 (from the past 24 hour(s))  Urinalysis, Routine w reflex microscopic (not at [facility])     Status: Abnormal   Collection Time: 11/07/15  4:30 AM  Result Value Ref Range   Color, Urine YELLOW YELLOW   APPearance CLEAR CLEAR   Specific Gravity, Urine 1.011 1.005 - 1.030   pH 5.5 5.0 - 8.0   Glucose, UA NEGATIVE NEGATIVE  mg/dL   Hgb urine dipstick MODERATE (A) NEGATIVE   Bilirubin Urine NEGATIVE NEGATIVE   Ketones, ur NEGATIVE NEGATIVE mg/dL   Protein, ur NEGATIVE NEGATIVE mg/dL   Nitrite NEGATIVE NEGATIVE   Leukocytes, UA NEGATIVE NEGATIVE  Urine microscopic-add on     Status: Abnormal   Collection Time: 11/07/15  4:30 AM  Result Value Ref Range   Squamous Epithelial / LPF 0-5 (A) NONE SEEN   WBC, UA 0-5 0 - 5 WBC/hpf   RBC / HPF 6-30 0 - 5 RBC/hpf   Bacteria, UA  FEW (A) NONE SEEN   Urine-Other MUCOUS PRESENT    We'll change patient's analgesic to hydromorphone.     Paula LibraJohn Katherinne Mofield, MD 11/07/15 [phone]

## 2015-11-07 NOTE — ED Notes (Signed)
Pt verbalizes understanding of d/c instructions and denies any further needs at this time. 

## 2015-11-07 NOTE — ED Notes (Signed)
MD at bedside. 

## 2017-05-25 ENCOUNTER — Emergency Department (HOSPITAL_BASED_OUTPATIENT_CLINIC_OR_DEPARTMENT_OTHER)
Admission: EM | Admit: 2017-05-25 | Discharge: 2017-05-25 | Disposition: A | Discharge status: Home / Self Care | Payer: Self-pay | Attending: Emergency Medicine | Admitting: Emergency Medicine

## 2017-05-25 ENCOUNTER — Emergency Department (HOSPITAL_BASED_OUTPATIENT_CLINIC_OR_DEPARTMENT_OTHER): Payer: Self-pay

## 2017-05-25 ENCOUNTER — Encounter (HOSPITAL_BASED_OUTPATIENT_CLINIC_OR_DEPARTMENT_OTHER): Payer: Self-pay

## 2017-05-25 ENCOUNTER — Other Ambulatory Visit: Payer: Self-pay

## 2017-05-25 DIAGNOSIS — H938X3 Other specified disorders of ear, bilateral: Secondary | ICD-10-CM

## 2017-05-25 DIAGNOSIS — Z79899 Other long term (current) drug therapy: Secondary | ICD-10-CM | POA: Insufficient documentation

## 2017-05-25 DIAGNOSIS — R0789 Other chest pain: Secondary | ICD-10-CM | POA: Insufficient documentation

## 2017-05-25 DIAGNOSIS — H9390 Unspecified disorder of ear, unspecified ear: Secondary | ICD-10-CM | POA: Insufficient documentation

## 2017-05-25 LAB — BASIC METABOLIC PANEL
ANION GAP: 9 (ref 5–15)
BUN: 17 mg/dL (ref 6–20)
CALCIUM: 9.3 mg/dL (ref 8.9–10.3)
CHLORIDE: 98 mmol/L — AB (ref 101–111)
CO2: 27 mmol/L (ref 22–32)
Creatinine, Ser: 1.06 mg/dL (ref 0.61–1.24)
GFR calc non Af Amer: >60 mL/min (ref >60)
Glucose, Bld: 98 mg/dL (ref 65–99)
Potassium: 3.4 mmol/L — ABNORMAL LOW (ref 3.5–5.1)
SODIUM: 134 mmol/L — AB (ref 135–145)

## 2017-05-25 LAB — CBC
HCT: 41.8 % (ref 39.0–52.0)
HEMOGLOBIN: 14.4 g/dL (ref 13.0–17.0)
MCH: 30.5 pg (ref 26.0–34.0)
MCHC: 34.4 g/dL (ref 30.0–36.0)
MCV: 88.6 fL (ref 78.0–100.0)
PLATELETS: 313 10*3/uL (ref 150–400)
RBC: 4.72 MIL/uL (ref 4.22–5.81)
RDW: 12.5 % (ref 11.5–15.5)
WBC: 10.7 10*3/uL — AB (ref 4.0–10.5)

## 2017-05-25 LAB — TROPONIN I: Troponin I: <0.03 ng/mL (ref <0.03)

## 2017-05-25 MED ORDER — PANTOPRAZOLE SODIUM 40 MG IV SOLR
40.0000 mg | Freq: Once | INTRAVENOUS | Status: AC
  Administered 2017-05-25: 40 mg via INTRAVENOUS
  Filled 2017-05-25: qty 40

## 2017-05-25 MED ORDER — SUCRALFATE 1 GM/10ML PO SUSP
1.0000 g | Freq: Once | ORAL | Status: AC
  Administered 2017-05-25: 1 g via ORAL
  Filled 2017-05-25: qty 10

## 2017-05-25 NOTE — ED Provider Notes (Signed)
[facility] Provider Note: Lowella Dell, MD, FACEP  CSN: [id number] MRN: [id number] ARRIVAL: 05/25/17 at 0055 ROOM: MH10/[facility]10   CHIEF COMPLAINT  Chest Pain   HISTORY OF PRESENT ILLNESS  05/25/17 1:38 AM Joseph Foley is a 37 y.o. male who had a pain in the back of his neck about 7:30 PM yesterday evening.  He then felt like his ears were going to pop, as if you were under water.  He continues to have a sensation of pressure in his ears.  He told his triage nurse that this sensation was more significant in his right ear but he tells me that there is no difference.  Soon after the onset of the neck pain in the ear discomfort he developed pain in his chest.  The pain in his chest is located to the left of the lower sternum.  He describes it as having both aching and sharp components.  The sharp component occurs intermittently in a stabbing-like manner.  He rates his pain as a 4 out of 10.  The pain does not radiate.  It does not change with movement or deep breathing.  He does feel some degree of shortness of breath.  He states he feels dizzy, is then off balance, when standing.  He is not nauseated.  His symptoms are making him anxious and he states his blood pressure was elevated at home.  He denies leg pain or swelling.   Past Medical History:  Diagnosis Date  . Kidney stones   . Migraine     History reviewed. No pertinent surgical history.  No family history on file.  Social History   Tobacco Use  . Smoking status: Never Smoker  Substance Use Topics  . Alcohol use: No  . Drug use: No    Prior to Admission medications   Medication Sig Start Date End Date Taking? Authorizing Provider  Diclofenac Sodium CR (VOLTAREN-XR) 100 MG 24 hr tablet Take 1 tablet (100 mg total) by mouth daily. Patient not taking: Reported on 05/25/2017 11/03/15   Palumbo, April, MD  HYDROmorphone (DILAUDID) 4 MG tablet Take 0.5-1 tablets (2-4 mg total) by mouth every 4 (four) hours as needed  for severe pain (for pain). Patient not taking: Reported on 05/25/2017 11/07/15   Keion Neels, Jonny Ruiz, MD  ondansetron (ZOFRAN ODT) 8 MG disintegrating tablet 8mg  ODT q8 hours prn nausea Patient not taking: Reported on 05/25/2017 11/03/15   Palumbo, April, MD  tamsulosin (FLOMAX) 0.4 MG CAPS capsule Take 1 capsule (0.4 mg total) by mouth daily. Patient not taking: Reported on 05/25/2017 11/03/15   Nicanor Alcon, April, MD    Allergies Patient has no known allergies.   REVIEW OF SYSTEMS  Negative except as noted here or in the History of Present Illness.   PHYSICAL EXAMINATION  Initial Vital Signs Blood pressure (!) 155/91, pulse 74, temperature 98.3 F (36.8 C), temperature source Oral, resp. rate 17, height 5\' 7"  (1.702 m), weight 99.8 kg (220 lb), SpO2 96 %.  Examination General: Well-developed, well-nourished male in no acute distress; appearance consistent with age of record HENT: normocephalic; atraumatic; left TM normal; right TM mildly erythematous  Eyes: pupils equal, round and reactive to light; extraocular muscles intact Neck: supple Heart: regular rate and rhythm; no murmur Lungs: clear to auscultation bilaterally Abdomen: soft; nondistended; nontender; bowel sounds present Extremities: No deformity; full range of motion; pulses normal Neurologic: Awake, alert and oriented; motor function intact in all extremities and symmetric; no facial droop Skin: Warm and  dry Psychiatric: Anxious   RESULTS  Summary of this visit's results, reviewed by myself:   EKG Interpretation  Date/Time:  Wednesday May 25 2017 01:03:47 EST Ventricular Rate:  96 PR Interval:    QRS Duration: 99 QT Interval:  352 QTC Calculation: 445 R Axis:   48 Text Interpretation:  Sinus rhythm Borderline T wave abnormalities Baseline wander in lead(s) II III aVF No significant change was found Confirmed by Paula LibraMolpus, Tayjon Halladay ([id number]) on 05/25/2017 1:49:59 AM      Laboratory Studies: Results for orders placed or  performed during the hospital encounter of 05/25/17 (from the past 24 hour(s))  Basic metabolic panel     Status: Abnormal   Collection Time: 05/25/17  1:09 AM  Result Value Ref Range   Sodium 134 (L) 135 - 145 mmol/L   Potassium 3.4 (L) 3.5 - 5.1 mmol/L   Chloride 98 (L) 101 - 111 mmol/L   CO2 27 22 - 32 mmol/L   Glucose, Bld 98 65 - 99 mg/dL   BUN 17 6 - 20 mg/dL   Creatinine, Ser 7.821.06 0.61 - 1.24 mg/dL   Calcium 9.3 8.9 - 95.610.3 mg/dL   GFR calc non Af Amer >60 >60 mL/min   GFR calc Af Amer >60 >60 mL/min   Anion gap 9 5 - 15  CBC     Status: Abnormal   Collection Time: 05/25/17  1:09 AM  Result Value Ref Range   WBC 10.7 (H) 4.0 - 10.5 K/uL   RBC 4.72 4.22 - 5.81 MIL/uL   Hemoglobin 14.4 13.0 - 17.0 g/dL   HCT 21.341.8 08.639.0 - 57.852.0 %   MCV 88.6 78.0 - 100.0 fL   MCH 30.5 26.0 - 34.0 pg   MCHC 34.4 30.0 - 36.0 g/dL   RDW 46.912.5 62.911.5 - 52.815.5 %   Platelets 313 150 - 400 K/uL  Troponin I     Status: None   Collection Time: 05/25/17  1:09 AM  Result Value Ref Range   Troponin I <0.03 <0.03 ng/mL  Troponin I     Status: None   Collection Time: 05/25/17  3:45 AM  Result Value Ref Range   Troponin I <0.03 <0.03 ng/mL   Imaging Studies: Dg Chest 2 View  Result Date: 05/25/2017 CLINICAL DATA:  Chest pain EXAM: CHEST  2 VIEW COMPARISON:  04/21/2012 FINDINGS: The heart size and mediastinal contours are within normal limits. Both lungs are clear. The visualized skeletal structures are unremarkable. BB over the right upper chest IMPRESSION: No active cardiopulmonary disease. Electronically Signed   By: Jasmine PangKim  Fujinaga M.D.   On: 05/25/2017 01:45    ED COURSE  Nursing notes and initial vitals signs, including pulse oximetry, reviewed.  Vitals:   05/25/17 0101 05/25/17 0130 05/25/17 0135 05/25/17 0200  BP:   (!) 155/91 130/73  Pulse:  81 74 74  Resp:  15 17 17   Temp:      TempSrc:      SpO2:  98% 96% 100%  Weight: 99.8 kg (220 lb)     Height: 5\' 7"  (1.702 m)      4:25 AM Patient has  been asymptomatic after being given IV Protonix and oral Carafate.  His symptoms may thus be gastrointestinal in nature.  His EKG is unremarkable and he has had 2- serial troponins.  He is low risk for cardiovascular disease.  He was advised of the findings in his right ear suggesting early otitis media but he would prefer not to take  an antibiotic at this point unless his symptoms worsen.  PROCEDURES    ED DIAGNOSES     ICD-10-CM   1. Atypical chest pain R07.89   2. Ear pressure, bilateral H93.8X3        Sho Salguero, Jonny Ruiz, MD 05/25/17 [phone]

## 2017-05-25 NOTE — ED Triage Notes (Addendum)
Pt c/o pain in the middle of his chest that started in the back of his neck, radiated to his right ear and is now in the center of his chest, accompanied by SOB and dizziness.  Pt tried 800 mg ibuprofen at 2300.

## 2017-06-15 ENCOUNTER — Emergency Department (HOSPITAL_BASED_OUTPATIENT_CLINIC_OR_DEPARTMENT_OTHER): Payer: Self-pay

## 2017-06-15 ENCOUNTER — Other Ambulatory Visit: Payer: Self-pay

## 2017-06-15 ENCOUNTER — Emergency Department (HOSPITAL_BASED_OUTPATIENT_CLINIC_OR_DEPARTMENT_OTHER)
Admission: EM | Admit: 2017-06-15 | Discharge: 2017-06-15 | Disposition: A | Discharge status: Home / Self Care | Payer: Self-pay | Attending: Emergency Medicine | Admitting: Emergency Medicine

## 2017-06-15 ENCOUNTER — Encounter (HOSPITAL_BASED_OUTPATIENT_CLINIC_OR_DEPARTMENT_OTHER): Payer: Self-pay | Admitting: Emergency Medicine

## 2017-06-15 DIAGNOSIS — Y9301 Activity, walking, marching and hiking: Secondary | ICD-10-CM | POA: Insufficient documentation

## 2017-06-15 DIAGNOSIS — I10 Essential (primary) hypertension: Secondary | ICD-10-CM | POA: Insufficient documentation

## 2017-06-15 DIAGNOSIS — Z79899 Other long term (current) drug therapy: Secondary | ICD-10-CM | POA: Insufficient documentation

## 2017-06-15 DIAGNOSIS — W0110XA Fall on same level from slipping, tripping and stumbling with subsequent striking against unspecified object, initial encounter: Secondary | ICD-10-CM | POA: Insufficient documentation

## 2017-06-15 DIAGNOSIS — S60221A Contusion of right hand, initial encounter: Secondary | ICD-10-CM | POA: Insufficient documentation

## 2017-06-15 DIAGNOSIS — Y999 Unspecified external cause status: Secondary | ICD-10-CM | POA: Insufficient documentation

## 2017-06-15 DIAGNOSIS — Y92480 Sidewalk as the place of occurrence of the external cause: Secondary | ICD-10-CM | POA: Insufficient documentation

## 2017-06-15 HISTORY — DX: Essential (primary) hypertension: I10

## 2017-06-15 MED ORDER — IBUPROFEN 800 MG PO TABS
800.0000 mg | ORAL_TABLET | Freq: Once | ORAL | Status: AC
  Administered 2017-06-15: 800 mg via ORAL
  Filled 2017-06-15: qty 1

## 2017-06-15 NOTE — Discharge Instructions (Signed)
Your symptoms are likely related to a hand sprain/contusion.  There were no signs of fractures or dislocations on the x-ray.  Continue to take Tylenol and ibuprofen every 8 hours as needed for pain.  This pain should improve over the next week.  If he continued to have pain beyond 6 weeks, seek your primary care physician for further follow-up.

## 2017-06-15 NOTE — ED Provider Notes (Signed)
[facility] EMERGENCY DEPARTMENT Provider Note   CSN: [id number] Arrival date & time: 06/15/17  1710     History   Chief Complaint Chief Complaint  Patient presents with  . Hand Injury    right    HPI Joseph Foley is a 37 y.o. male.  Patient is a 37 year old male presenting with right hand pain.  Patient was walking on the sidewalk earlier today backwards and fell landing on his right hand.  Patient made a fist with his right hand during the fall.  He subsequently had pain with primarily right carpal joints near right ring finger.  Patient is right-handed.  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      Past Medical History:  Diagnosis Date  . Hypertension   . Kidney stones   . Migraine     There are no active problems to display for this patient.   History reviewed. No pertinent surgical history.     Home Medications    Prior to Admission medications   Medication Sig Start Date End Date Taking? Authorizing Provider  lisinopril (PRINIVIL,ZESTRIL) 20 MG tablet Take 20 mg by mouth daily.   Yes [provider]    Family History History reviewed. No pertinent family history.  Social History Social History   Tobacco Use  . Smoking status: Never Smoker  Substance Use Topics  . Alcohol use: No  . Drug use: No     Allergies   Patient has no known allergies.   Review of Systems Review of Systems  Constitutional: Negative for chills and fever.  HENT: Negative for ear pain and sore throat.   Eyes: Negative for pain and visual disturbance.  Respiratory: Negative for cough and shortness of breath.   Cardiovascular: Negative for chest pain and palpitations.  Gastrointestinal: Negative for abdominal pain and vomiting.  Genitourinary: Negative for dysuria and hematuria.  Musculoskeletal: Negative for arthralgias and back pain.  Skin: Negative for color change and rash.  Neurological: Negative for  seizures and syncope.  All other systems reviewed and are negative.    Physical Exam Updated Vital Signs BP 114/62 (BP Location: Left Arm)   Pulse 97   Temp 99.1 F (37.3 C) (Oral)   Resp 18   Ht 5\' 7"  (1.702 m)   Wt 95.3 kg (210 lb)   SpO2 97%   BMI 32.89 kg/m   Physical Exam  Constitutional: He appears well-developed and well-nourished.  HENT:  Head: Normocephalic and atraumatic.  Eyes: Conjunctivae are normal.  Neck: Neck supple.  Cardiovascular: Normal rate and regular rhythm.  No murmur heard. Pulmonary/Chest: Effort normal and breath sounds normal. No respiratory distress.  Abdominal: Soft. There is no tenderness.  Musculoskeletal: Normal range of motion. He exhibits tenderness. He exhibits no edema or deformity.  Neurological: He is alert.  Skin: Skin is warm and dry.  Psychiatric: He has a normal mood and affect.  Nursing note and vitals reviewed.    ED Treatments / Results  Labs (all labs ordered are listed, but only abnormal results are displayed) Labs Reviewed - No data to display  EKG  EKG Interpretation None       Radiology Dg Hand Complete Right  Result Date: 06/15/2017 CLINICAL DATA:  Fall.  Right hand pain 3rd through 5th metacarpals EXAM: RIGHT HAND - COMPLETE 3+ VIEW COMPARISON:  None. FINDINGS: There is no evidence of fracture or dislocation. There is no evidence of arthropathy or  other focal bone abnormality. Soft tissues are unremarkable. IMPRESSION: Negative. Electronically Signed   By: Charlett Nose M.D.   On: 06/15/2017 18:22    Procedures Procedures (including critical care time)  Medications Ordered in ED Medications  ibuprofen (ADVIL,MOTRIN) tablet 800 mg (800 mg Oral Given 06/15/17 1746)     Initial Impression / Assessment and Plan / ED Course  I have reviewed the triage vital signs and the nursing notes.  Pertinent labs & imaging results that were available during my care of the patient were reviewed by me and considered in  my medical decision making (see chart for details).  Vital signs stable.  Patient is right-hand minimally swollen primarily on hyperthenar eminence.  Range of motion intact on all digits with intact flexion both on distal and interphalangeal joints.  No tenderness consistent with scaphoid fracture or boxer's fracture.  Complete x-ray of right hand unremarkable for fracture or subluxation.  Patient given ibuprofen and ice with instructions to follow-up primary care physician if pain does not improve over the next few weeks.  Patient declined work note.  Final Clinical Impressions(s) / ED Diagnoses   Final diagnoses:  Contusion of right hand, initial encounter    ED Discharge Orders    None       Wendee Beavers, DO 06/15/17 1905    Charlynne Pander, MD 06/15/17 [phone]

## 2017-06-15 NOTE — ED Triage Notes (Signed)
Pt reports right hand injury , fell today , landed on right side . No head injury , no loc. Alert and oriented x 4, no swelling noted, painful ROM.

## 2017-06-19 IMAGING — CT CT RENAL STONE PROTOCOL
2 of 3 series · 17 of 46 positions shown, 19 images · non-contrast
Comparison: CT of the abdomen and pelvis from 07/01/2015

CLINICAL DATA: Acute onset of left flank and left lower quadrant
abdominal pain. Initial encounter.

EXAM:
CT ABDOMEN AND PELVIS WITHOUT CONTRAST
TECHNIQUE: Multidetector CT imaging of the abdomen and pelvis was performed
following the standard protocol without IV contrast.

[Series 2: axial st · axial · 0.89mm/px · z∈[-508,-98]mm · 14 of 96 slices shown, 16 images]
[im 7/96  soft-tissue]
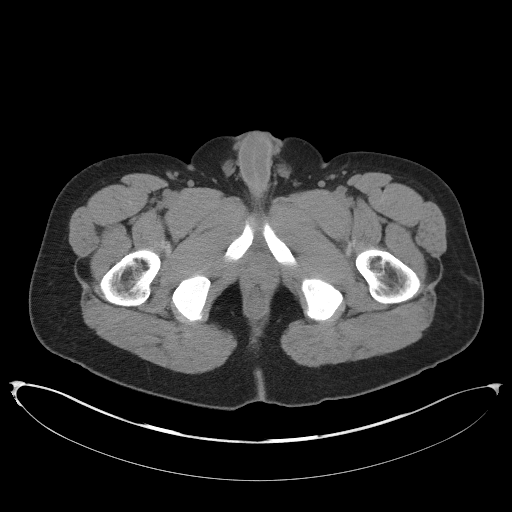
[im 7/96  bone]
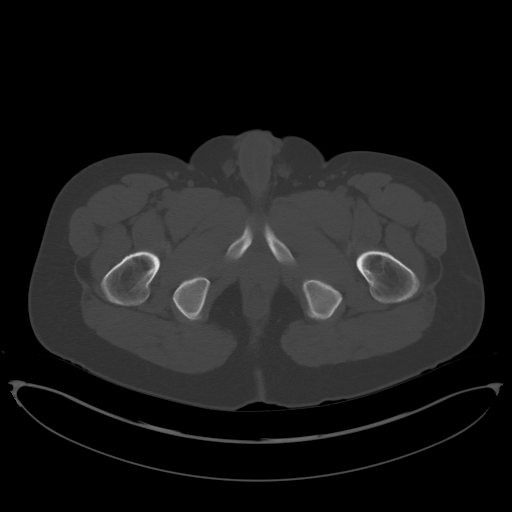
[im 13/96  soft-tissue]
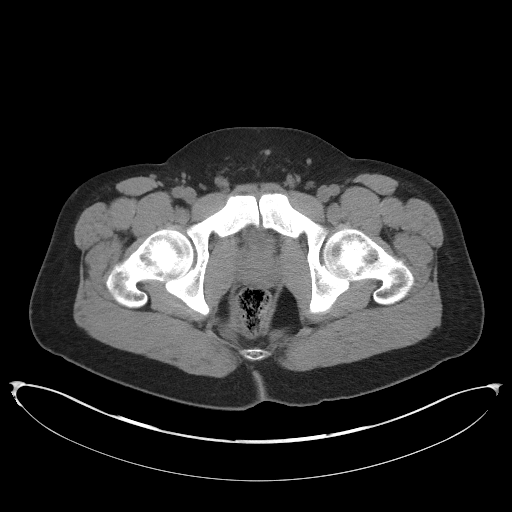
[im 19/96  soft-tissue]
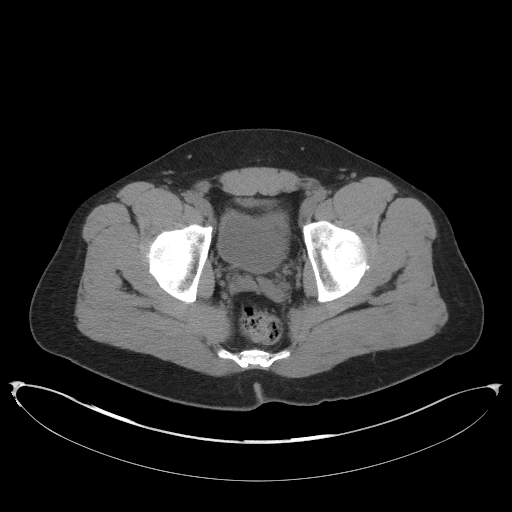
[im 25/96  soft-tissue]
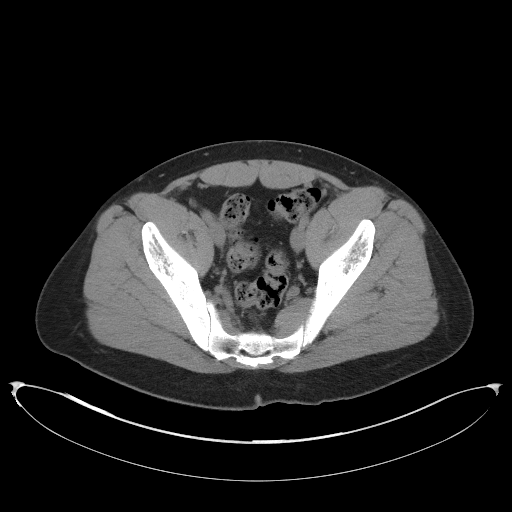
[im 31/96  soft-tissue]
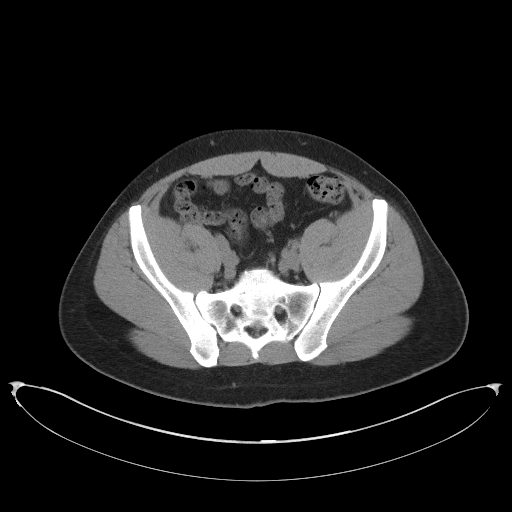
[im 37/96  soft-tissue]
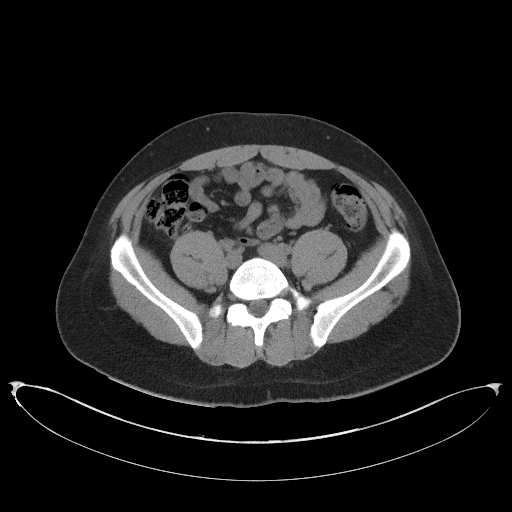
[im 43/96  soft-tissue]
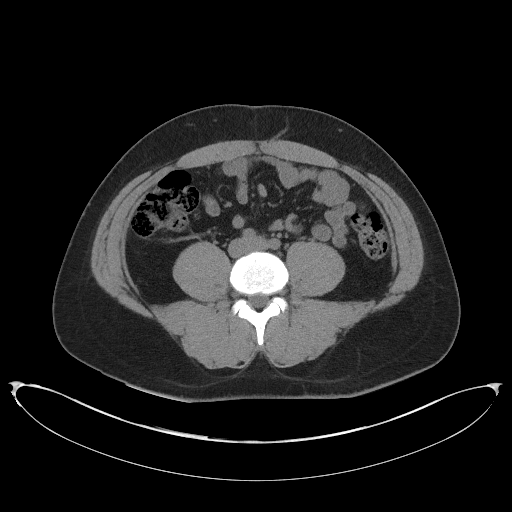
[im 53/96  soft-tissue]
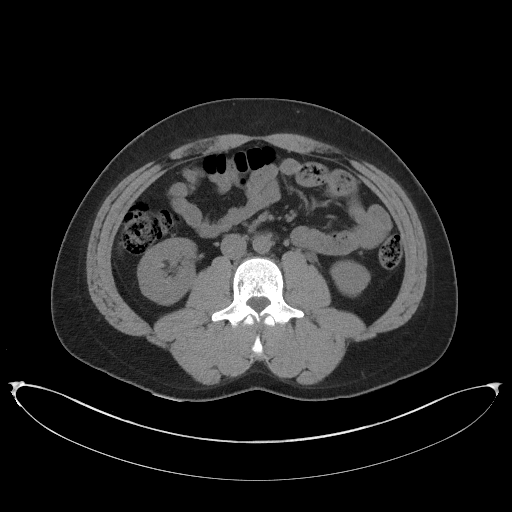
[im 59/96  soft-tissue]
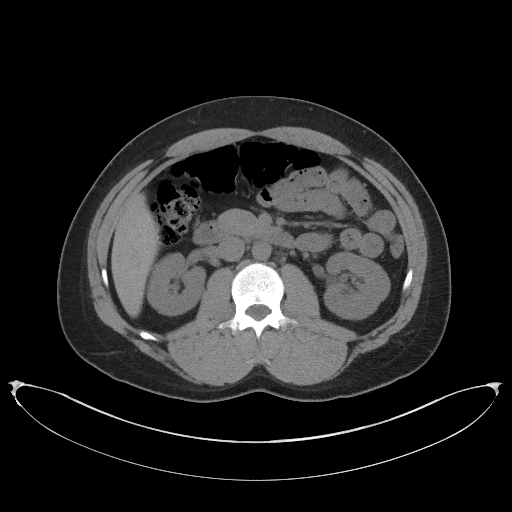
[im 59/96  bone]
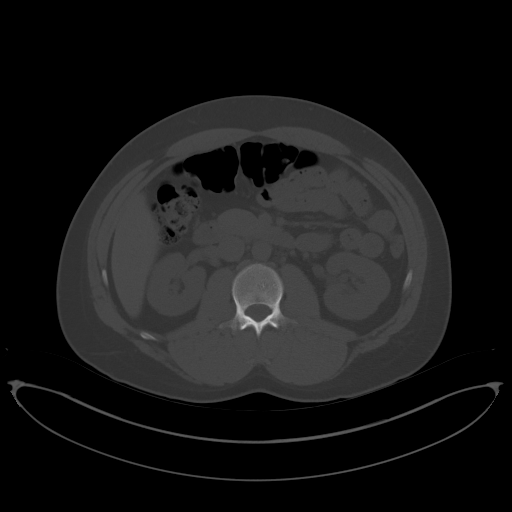
[im 65/96  soft-tissue]
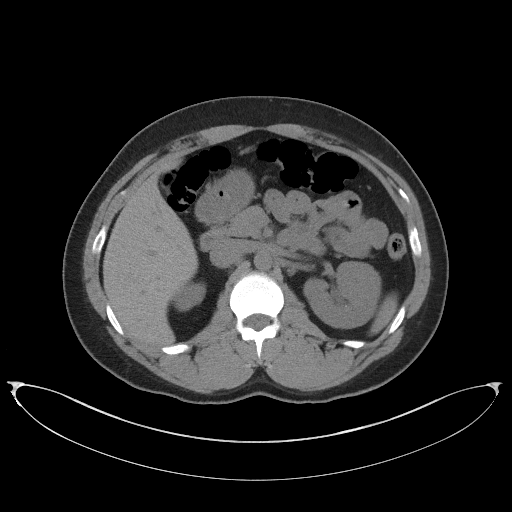
[im 71/96  soft-tissue]
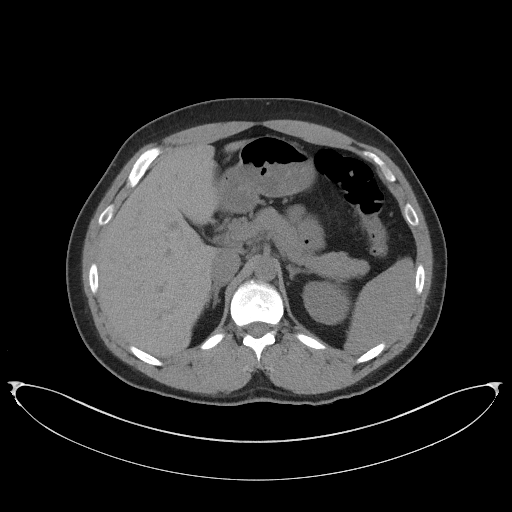
[im 77/96  soft-tissue]
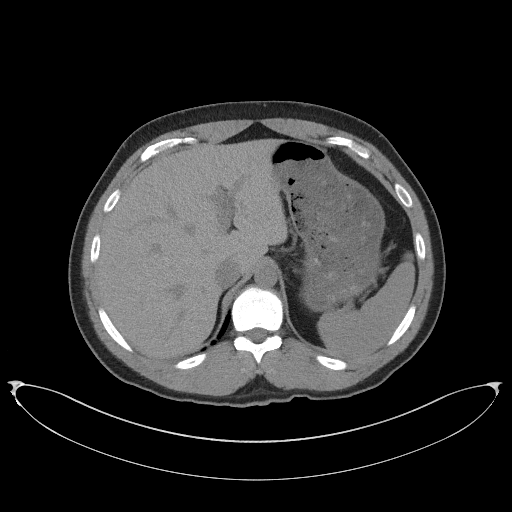
[im 83/96  soft-tissue]
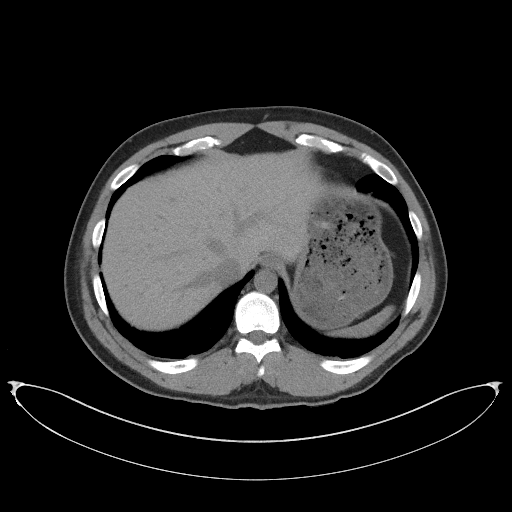
[im 89/96  soft-tissue]
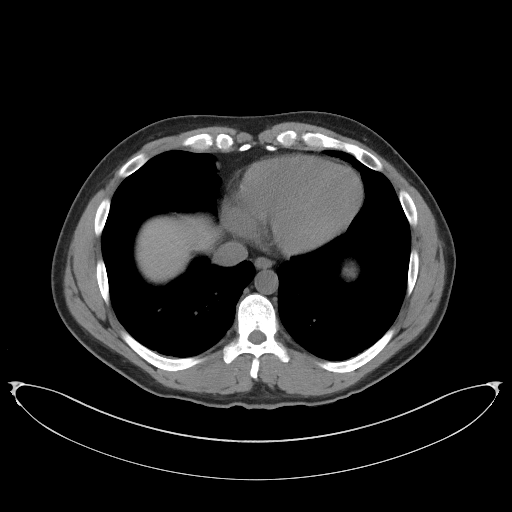

[Series 4: coronal st · coronal · 1.05mm/px · 3 of 90 slices shown]
[im 30/90  soft-tissue]
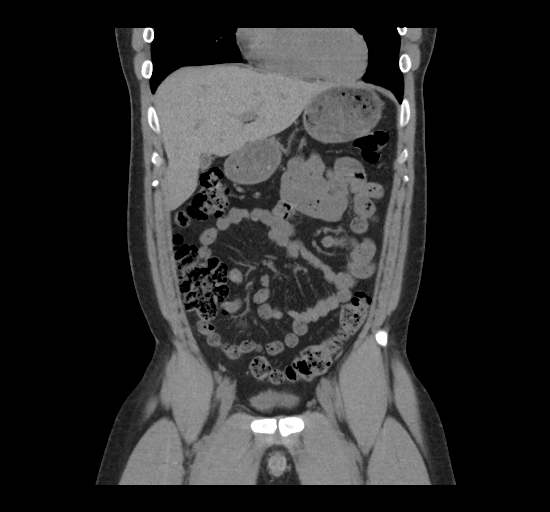
[im 40/90  soft-tissue]
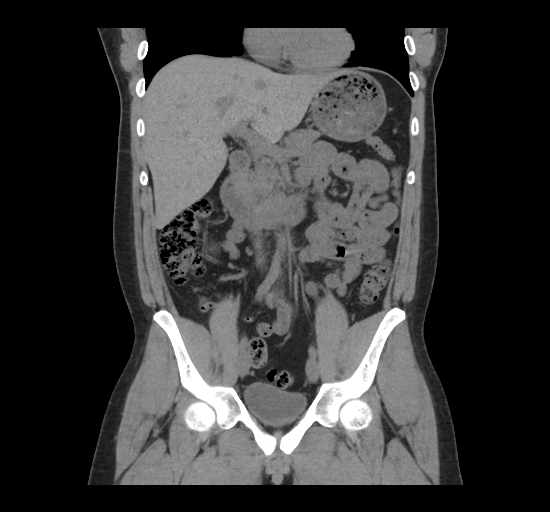
[im 50/90  soft-tissue]
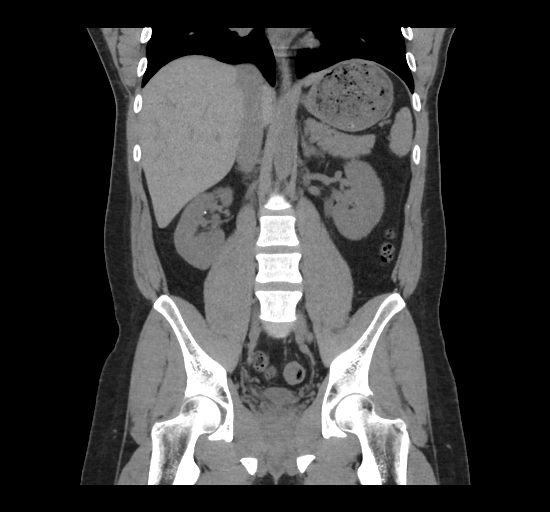

[17 of 46 positions shown; findings below may reference images not displayed]

FINDINGS: The visualized lung bases are clear.

The liver and spleen are unremarkable in appearance. The gallbladder
is decompressed and within normal limits. The pancreas and adrenal
glands are unremarkable.

There is minimal left-sided hydronephrosis, with 2 apparent
obstructing stones noted at the proximal left ureter just below the
left renal pelvis, measuring 5 mm and 4 mm in size. Scattered
nonobstructing bilateral renal stones are seen, measuring up to 3 mm
in size. No perinephric stranding is appreciated.

No free fluid is identified. The small bowel is unremarkable in
appearance. The stomach is within normal limits. No acute vascular
abnormalities are seen.

The appendix is normal in caliber and contains air, without evidence
of appendicitis. The colon is unremarkable in appearance.

The bladder is mildly distended and grossly unremarkable. The
prostate remains normal in size. No inguinal lymphadenopathy is
seen.

No acute osseous abnormalities are identified.
IMPRESSION: 1. Minimal left-sided hydronephrosis, with 2 apparent obstructing
stones at the proximal left ureter just below the left renal pelvis,
measuring 5 mm and 4 mm in size.
2. Scattered nonobstructing bilateral renal stones measure up to 3
mm in size.

## 2018-12-02 ENCOUNTER — Other Ambulatory Visit: Payer: Self-pay

## 2018-12-02 ENCOUNTER — Encounter (HOSPITAL_COMMUNITY): Payer: Self-pay | Admitting: Emergency Medicine

## 2018-12-02 ENCOUNTER — Emergency Department (HOSPITAL_COMMUNITY)
Admission: EM | Admit: 2018-12-02 | Discharge: 2018-12-03 | Disposition: A | Discharge status: Home / Self Care | Payer: PRIVATE HEALTH INSURANCE | Attending: Emergency Medicine | Admitting: Emergency Medicine

## 2018-12-02 DIAGNOSIS — I10 Essential (primary) hypertension: Secondary | ICD-10-CM | POA: Diagnosis not present

## 2018-12-02 DIAGNOSIS — F1290 Cannabis use, unspecified, uncomplicated: Secondary | ICD-10-CM | POA: Diagnosis present

## 2018-12-02 DIAGNOSIS — F12920 Cannabis use, unspecified with intoxication, uncomplicated: Secondary | ICD-10-CM

## 2018-12-02 NOTE — ED Notes (Addendum)
Pt's wife Scientist, water quality) was told by EMS that she could come back with the pt. Advised that he needed to get into a room and if/when visitors were allowed, she could then go back. Pt's wife is waiting in the car in the parking lot and would like to be called if information needed and/or when visitor is allowed. Phone number is [phone].

## 2018-12-02 NOTE — ED Triage Notes (Addendum)
Pt presents by [facility] for evaluation of THC use. EMS reports that pt ingested 2 squares of THC infused chocolate bar. Pt denies any pain at this time. EMS reported pt had an episode of emesis prior to arrival.

## 2018-12-03 NOTE — ED Provider Notes (Signed)
[facility]-EMERGENCY DEPT Provider Note   CSN: [id number] Arrival date & time: 12/02/18  2157    History   Chief Complaint Chief Complaint  Patient presents with  . thc use    HPI Joseph Foley is a 38 y.o. male.     38 year old male with history of hypertension and migraines presents to the emergency department for evaluation of acute marijuana intoxication.  He ate 2 squares from a marijuana infused chocolate bar around 2100 tonight.  Did have one episode of vomiting prior to arrival.  Currently denies nausea.  He has no complaints of chest pain, shortness of breath.  Denies hallucinations.  No other drug ingestion tonight.  He did have 1 alcoholic beverage this evening.  The history is provided by the patient. No language interpreter was used.    Past Medical History:  Diagnosis Date  . Hypertension   . Kidney stones   . Migraine     There are no active problems to display for this patient.   No past surgical history on file.      Home Medications    Prior to Admission medications   Medication Sig Start Date End Date Taking? Authorizing Provider  lisinopril (PRINIVIL,ZESTRIL) 20 MG tablet Take 20 mg by mouth daily.    [provider]    Family History No family history on file.  Social History Social History   Tobacco Use  . Smoking status: Never Smoker  . Smokeless tobacco: Never Used  Substance Use Topics  . Alcohol use: No  . Drug use: Yes    Types: Marijuana     Allergies   Patient has no known allergies.   Review of Systems Review of Systems Ten systems reviewed and are negative for acute change, except as noted in the HPI.    Physical Exam Updated Vital Signs BP 122/72 (BP Location: Right Arm)   Pulse 95   Temp 98.7 F (37.1 C) (Oral)   Resp 18   Ht 5\' 8"  (1.727 m)   Wt 76.2 kg   SpO2 100%   BMI 25.54 kg/m   Physical Exam Vitals signs and nursing note reviewed.  Constitutional:      General:  He is not in acute distress.    Appearance: He is well-developed. He is not diaphoretic.     Comments: Nontoxic-appearing and in no distress  HENT:     Head: Normocephalic and atraumatic.  Eyes:     General: No scleral icterus.    Conjunctiva/sclera: Conjunctivae normal.  Neck:     Musculoskeletal: Normal range of motion.  Pulmonary:     Effort: Pulmonary effort is normal. No respiratory distress.     Breath sounds: No stridor. No wheezing or rales.     Comments: Respirations even and unlabored.  Lungs clear to auscultation bilaterally. Musculoskeletal: Normal range of motion.  Skin:    General: Skin is warm and dry.     Coloration: Skin is not pale.     Findings: No erythema or rash.  Neurological:     Mental Status: He is alert and oriented to person, place, and time.     Comments: GCS 15.  Speech is goal oriented.  Patient answers questions appropriately and follows commands.  Moving all extremities spontaneously.  Psychiatric:        Speech: Speech is delayed.        Behavior: Behavior is slowed.      ED Treatments / Results  Labs (all labs  ordered are listed, but only abnormal results are displayed) Labs Reviewed - No data to display  EKG None  Radiology No results found.  Procedures Procedures (including critical care time)  Medications Ordered in ED Medications - No data to display   12:08 AM Spoke with wife about reassuring evaluation and that symptoms and present state consistent with acute marijuana intoxication.  Provided reassurance on patient's stability for discharge.  Wife to pick up patient in 15 minutes.   Initial Impression / Assessment and Plan / ED Course  I have reviewed the triage vital signs and the nursing notes.  Pertinent labs & imaging results that were available during my care of the patient were reviewed by me and considered in my medical decision making (see chart for details).        38 year old male presenting with  uncomplicated acute marijuana intoxication after eating 2 squares of THC infused chocolate.  Reports ingestion to be around 2100 tonight.  Had one episode of vomiting after ingestion, but denies any nausea currently.  Has tolerated water without difficulty.  Vitals are stable.  The patient does remain clinically intoxicated, though he is stable for discharge and continued outpatient sobering.  Wife updated and will drive the patient home.  He was discharged in stable condition.   Final Clinical Impressions(s) / ED Diagnoses   Final diagnoses:  Cannabis intoxication without complication [facility])    ED Discharge Orders    None       Antonietta Breach, PA-C 12/03/18 6948    Veryl Speak, MD 12/03/18 [phone]

## 2020-06-18 ENCOUNTER — Other Ambulatory Visit: Payer: Self-pay

## 2020-06-18 ENCOUNTER — Emergency Department (HOSPITAL_BASED_OUTPATIENT_CLINIC_OR_DEPARTMENT_OTHER)
Admission: EM | Admit: 2020-06-18 | Discharge: 2020-06-19 | Disposition: A | Discharge status: Home / Self Care | Payer: Self-pay | Attending: Emergency Medicine | Admitting: Emergency Medicine

## 2020-06-18 ENCOUNTER — Emergency Department (HOSPITAL_BASED_OUTPATIENT_CLINIC_OR_DEPARTMENT_OTHER): Payer: Self-pay

## 2020-06-18 ENCOUNTER — Encounter (HOSPITAL_BASED_OUTPATIENT_CLINIC_OR_DEPARTMENT_OTHER): Payer: Self-pay

## 2020-06-18 DIAGNOSIS — R0602 Shortness of breath: Secondary | ICD-10-CM | POA: Insufficient documentation

## 2020-06-18 DIAGNOSIS — R079 Chest pain, unspecified: Secondary | ICD-10-CM

## 2020-06-18 DIAGNOSIS — R110 Nausea: Secondary | ICD-10-CM | POA: Insufficient documentation

## 2020-06-18 DIAGNOSIS — R0789 Other chest pain: Secondary | ICD-10-CM | POA: Insufficient documentation

## 2020-06-18 DIAGNOSIS — I10 Essential (primary) hypertension: Secondary | ICD-10-CM | POA: Insufficient documentation

## 2020-06-18 DIAGNOSIS — Z79899 Other long term (current) drug therapy: Secondary | ICD-10-CM | POA: Insufficient documentation

## 2020-06-18 LAB — CBC WITH DIFFERENTIAL/PLATELET
Abs Immature Granulocytes: 0.04 10*3/uL (ref 0.00–0.07)
Basophils Absolute: 0.1 10*3/uL (ref 0.0–0.1)
Basophils Relative: 1 %
Eosinophils Absolute: 0.1 10*3/uL (ref 0.0–0.5)
Eosinophils Relative: 1 %
HCT: 44.8 % (ref 39.0–52.0)
Hemoglobin: 15.1 g/dL (ref 13.0–17.0)
Immature Granulocytes: 0 %
Lymphocytes Relative: 32 %
Lymphs Abs: 3.4 10*3/uL (ref 0.7–4.0)
MCH: 30.4 pg (ref 26.0–34.0)
MCHC: 33.7 g/dL (ref 30.0–36.0)
MCV: 90.1 fL (ref 80.0–100.0)
Monocytes Absolute: 1 10*3/uL (ref 0.1–1.0)
Monocytes Relative: 9 %
Neutro Abs: 6.1 10*3/uL (ref 1.7–7.7)
Neutrophils Relative %: 57 %
Platelets: 357 10*3/uL (ref 150–400)
RBC: 4.97 MIL/uL (ref 4.22–5.81)
RDW: 11.9 % (ref 11.5–15.5)
WBC: 10.7 10*3/uL — ABNORMAL HIGH (ref 4.0–10.5)
nRBC: 0 % (ref 0.0–0.2)

## 2020-06-18 LAB — COMPREHENSIVE METABOLIC PANEL
ALT: 39 U/L (ref 0–44)
AST: 26 U/L (ref 15–41)
Albumin: 4.6 g/dL (ref 3.5–5.0)
Alkaline Phosphatase: 62 U/L (ref 38–126)
Anion gap: 10 (ref 5–15)
BUN: 19 mg/dL (ref 6–20)
CO2: 27 mmol/L (ref 22–32)
Calcium: 9.9 mg/dL (ref 8.9–10.3)
Chloride: 101 mmol/L (ref 98–111)
Creatinine, Ser: 1.01 mg/dL (ref 0.61–1.24)
GFR, Estimated: >60 mL/min (ref >60)
Glucose, Bld: 97 mg/dL (ref 70–99)
Potassium: 4.5 mmol/L (ref 3.5–5.1)
Sodium: 138 mmol/L (ref 135–145)
Total Bilirubin: 0.4 mg/dL (ref 0.3–1.2)
Total Protein: 7.9 g/dL (ref 6.5–8.1)

## 2020-06-18 LAB — TROPONIN I (HIGH SENSITIVITY): Troponin I (High Sensitivity): <2 ng/L (ref <18)

## 2020-06-18 MED ORDER — ONDANSETRON HCL 4 MG/2ML IJ SOLN
4.0000 mg | Freq: Once | INTRAMUSCULAR | Status: AC
  Administered 2020-06-18: 4 mg via INTRAVENOUS
  Filled 2020-06-18: qty 2

## 2020-06-18 MED ORDER — MORPHINE SULFATE (PF) 4 MG/ML IV SOLN: 4.0000 mg | Freq: Once | INTRAVENOUS | Status: DC

## 2020-06-18 MED ORDER — SODIUM CHLORIDE 0.9 % IV BOLUS
1000.0000 mL | Freq: Once | INTRAVENOUS | Status: AC
  Administered 2020-06-18: 1000 mL via INTRAVENOUS

## 2020-06-18 NOTE — ED Triage Notes (Addendum)
Pt c/o intermittent CP x 7-8 months-denies fever/flu sx-NAD-steady gait

## 2020-06-18 NOTE — ED Provider Notes (Signed)
[facility] EMERGENCY DEPARTMENT Provider Note   CSN: [id number] Arrival date & time: 06/18/20  2044     History Chief Complaint  Patient presents with  . Chest Pain    Joseph Foley is a 40 y.o. male with PMH/o HTN, migraine who presents for evaluation intermittent chest pain that has been occurring for the last 7-8 months.  He reports that over the last month, he has had intermittent episodes of chest pain.  He states that when he gets this pain, will feel like a pressure or tightness on his chest.  He is sometimes got nauseous but no diaphoresis, vomiting.  He states sometimes he will get dizzy, short of breath with it.  He states that he will feel pain in his chest and feels like it goes into his left arm and into his neck and jaw.  He states that this is happened randomly and is not tied any specific action, exertional activity.  He states he has had it with being at rest as well.  He has not gotten evaluated for it.  He states that today, he had an episode this morning around 9 AM.  He states that he has pressure across his chest and goes into his arm.  He states he still has the pressure and has been constant since 9 AM.  Currently rates it as a 3/10.  He states that he got dizzy, felt lightheaded with it.  He did get slightly nauseous but denies any vomiting.  He states he is continue to have shortness of breath.  He does not feel like his pain is worse with exertion or deep inspiration.  He states that the episodes have been more frequent, more severe which is partly what prompted his ED visit as well.  He also states that this is the longest that is lasted.  He denies any personal cardiac history.  He does have a history of high blood pressure.  He states his dad had a heart attack in his mid 30s.  He does smoke but states it does not frequently.  He denies any history of diabetes. He denies any exogenous hormone use recent immobilization, prior history of DVT/PE, recent surgery, leg  swelling, or long travel.  The history is provided by the patient.    HPI: A 40 year old patient with a history of hypertension and obesity presents for evaluation of chest pain. Initial onset of pain was more than 6 hours ago. The patient's chest pain is described as heaviness/pressure/tightness and is not worse with exertion. The patient complains of nausea. The patient's chest pain is middle- or left-sided, is not well-localized, is not sharp and does radiate to the arms/jaw/neck. The patient denies diaphoresis. The patient has smoked in the past 90 days and has a family history of coronary artery disease in a first-degree relative with onset less than age 38. The patient has no history of stroke, has no history of peripheral artery disease, denies any history of treated diabetes and has no history of hypercholesterolemia.   Past Medical History:  Diagnosis Date  . Hypertension   . Kidney stones   . Migraine     There are no problems to display for this patient.   History reviewed. No pertinent surgical history.     No family history on file.  Social History   Tobacco Use  . Smoking status: Never Smoker  . Smokeless tobacco: Never Used  Substance Use Topics  . Alcohol use: No  . Drug  use: Not Currently    Home Medications Prior to Admission medications   Medication Sig Start Date End Date Taking? Authorizing Provider  FLUoxetine (PROZAC) 10 MG capsule Take one capsule daily for one week then take 2 capsules daily 11/29/19  Yes [provider]  sildenafil (VIAGRA) 100 MG tablet Take by mouth. 11/29/19  Yes [provider]  traZODone (DESYREL) 100 MG tablet Take 1 1 /2-2 tablets at bedtime 11/29/19  Yes [provider]  lisinopril (PRINIVIL,ZESTRIL) 20 MG tablet Take 20 mg by mouth daily.    [provider]    Allergies    Patient has no known allergies.  Review of Systems   Review of Systems  Constitutional: Negative for fever.   Respiratory: Positive for shortness of breath. Negative for cough.   Cardiovascular: Positive for chest pain.  Gastrointestinal: Negative for abdominal pain, nausea and vomiting.  Genitourinary: Negative for dysuria and hematuria.  Neurological: Positive for light-headedness. Negative for headaches.  All other systems reviewed and are negative.   Physical Exam Updated Vital Signs BP 131/87   Pulse 76   Temp 98.4 F (36.9 C) (Oral)   Resp 18   Ht 5\' 7"  (1.702 m)   Wt 99.8 kg   SpO2 99%   BMI 34.46 kg/m   Physical Exam Vitals and nursing note reviewed.  Constitutional:      Appearance: Normal appearance. He is well-developed and well-nourished.  HENT:     Head: Normocephalic and atraumatic.     Mouth/Throat:     Mouth: Oropharynx is clear and moist and mucous membranes are normal.  Eyes:     General: Lids are normal.     Extraocular Movements: EOM normal.     Conjunctiva/sclera: Conjunctivae normal.     Pupils: Pupils are equal, round, and reactive to light.  Cardiovascular:     Rate and Rhythm: Normal rate and regular rhythm.     Pulses: Normal pulses.          Radial pulses are 2+ on the right side and 2+ on the left side.       Dorsalis pedis pulses are 2+ on the right side and 2+ on the left side.     Heart sounds: Normal heart sounds. No murmur heard. No friction rub. No gallop.   Pulmonary:     Effort: Pulmonary effort is normal.     Breath sounds: Normal breath sounds.     Comments: Lungs clear to auscultation bilaterally.  Symmetric chest rise.  No wheezing, rales, rhonchi. Abdominal:     Palpations: Abdomen is soft. Abdomen is not rigid.     Tenderness: There is no abdominal tenderness. There is no guarding.  Musculoskeletal:        General: Normal range of motion.     Cervical back: Full passive range of motion without pain.     Comments: BLE are symmetric in appearance without any overlying warmth, erythema, edema.   Skin:    General: Skin is warm and  dry.     Capillary Refill: Capillary refill takes less than 2 seconds.  Neurological:     Mental Status: He is alert and oriented to person, place, and time.  Psychiatric:        Mood and Affect: Mood is anxious.        Speech: Speech normal.     ED Results / Procedures / Treatments   Labs (all labs ordered are listed, but only abnormal results are displayed) Labs Reviewed  CBC WITH DIFFERENTIAL/PLATELET - Abnormal; Notable for the following components:      Result Value   WBC 10.7 (*)    All other components within normal limits  COMPREHENSIVE METABOLIC PANEL  TROPONIN I (HIGH SENSITIVITY)  TROPONIN I (HIGH SENSITIVITY)    EKG EKG Interpretation  Date/Time:  Wednesday June 18 2020 20:44:03 EST Ventricular Rate:  93 PR Interval:  136 QRS Duration: 86 QT Interval:  348 QTC Calculation: 432 R Axis:   59 Text Interpretation:  Poor data quality, interpretation may be adversely affected Normal sinus rhythm T wave abnormality, consider inferior ischemia Abnormal ECG No significant change since last tracing Confirmed by Frederick Peers [phone]) on 06/18/2020 9:17:11 PM   Radiology DG Chest 2 View  Result Date: 06/18/2020 CLINICAL DATA:  Chest pain EXAM: CHEST - 2 VIEW COMPARISON:  05/25/2017 FINDINGS: The heart size and mediastinal contours are within normal limits. Both lungs are clear. The visualized skeletal structures are unremarkable. IMPRESSION: No active cardiopulmonary disease. Electronically Signed   By: Deatra Robinson M.D.   On: 06/18/2020 22:04    Procedures Procedures   Medications Ordered in ED Medications  ondansetron [facility]) injection 4 mg (4 mg Intravenous Given 06/18/20 2149)  sodium chloride 0.9 % bolus 1,000 mL (0 mLs Intravenous Stopped 06/19/20 0156)    ED Course  I have reviewed the triage vital signs and the nursing notes.  Pertinent labs & imaging results that were available during my care of the patient were reviewed by me and considered in my  medical decision making (see chart for details).    MDM Rules/Calculators/A&P HEAR Score: 4                        40 y.o. M with PMH/o HTN who presents for evaluation of intermittent chest pain that has been ongoing for the past 7-8 months. He states that he has not gotten this evaluated. Today he had an episodes and he felt like it lasted longer. He feels like the episodes are occurring more frequently Patient is afebrile, non-toxic appearing, sitting comfortably on examination table. Vital signs reviewed and stable.  He is very anxious appearing on my exam.  There may be an anxiety component to his symptoms as he is very worried.  He does state that this is been ongoing for several months I doubt ACS etiology but it is a consideration.  He did not does not any pleuritic chest pain and denies any PE risk factors.  Do not suspect his symptoms are related to PE at this time.  History/physical exam is not concerning for dissection. We will plan to check chest x-ray, labs, EKG.  CBC shows slight leukocytosis of 10.7. CMP is normal. Trop is negative.   CXR negative for any infectious etiology.   Will plan for delta trop.  This time, I feel the patient's symptoms have an anxiety component to it as he is very anxious and worried.  I reassured that this is been intermittently occurring for several months.  He has never sought evaluation for this.  We will plan to give him an ambulatory referral to cardiology. Deltta trop pending. Patient signed out to oncoming provider.   Portions of this note were generated with Scientist, clinical (histocompatibility and immunogenetics). Dictation errors may occur despite best attempts at proofreading.   Final Clinical Impression(s) / ED Diagnoses Final diagnoses:  Nonspecific chest pain    Rx / DC Orders ED Discharge Orders  Ordered    Ambulatory referral to Cardiology        06/18/20 2337           Rosana Hoes 06/19/20 2114    Little, Ambrose Finland, MD 06/21/20  [phone]

## 2020-06-18 NOTE — ED Notes (Signed)
Patient transported to X-ray 

## 2020-06-18 NOTE — Discharge Instructions (Addendum)
As we discussed, your work up was reassuring here in the ED.   We have referred you to cardiology to follow up with given your symptoms and family history.   Return to the Emergency Department immediately if you experiencing worsening chest pain, difficulty breathing, nausea/vomiting, get very sweaty, headache or any other worsening or concerning symptoms.

## 2020-06-19 LAB — TROPONIN I (HIGH SENSITIVITY): Troponin I (High Sensitivity): <2 ng/L (ref <18)

## 2020-06-27 ENCOUNTER — Encounter: Payer: Self-pay | Admitting: General Practice

## 2023-06-16 ENCOUNTER — Emergency Department (HOSPITAL_BASED_OUTPATIENT_CLINIC_OR_DEPARTMENT_OTHER): Payer: Self-pay

## 2023-06-16 ENCOUNTER — Other Ambulatory Visit: Payer: Self-pay

## 2023-06-16 ENCOUNTER — Encounter (HOSPITAL_BASED_OUTPATIENT_CLINIC_OR_DEPARTMENT_OTHER): Payer: Self-pay | Admitting: Emergency Medicine

## 2023-06-16 DIAGNOSIS — I10 Essential (primary) hypertension: Secondary | ICD-10-CM | POA: Insufficient documentation

## 2023-06-16 DIAGNOSIS — Z79899 Other long term (current) drug therapy: Secondary | ICD-10-CM | POA: Insufficient documentation

## 2023-06-16 DIAGNOSIS — Z5329 Procedure and treatment not carried out because of patient's decision for other reasons: Secondary | ICD-10-CM | POA: Diagnosis not present

## 2023-06-16 DIAGNOSIS — R519 Headache, unspecified: Secondary | ICD-10-CM | POA: Diagnosis present

## 2023-06-16 LAB — CBC
HCT: 41.3 % (ref 39.0–52.0)
Hemoglobin: 14.2 g/dL (ref 13.0–17.0)
MCH: 30.1 pg (ref 26.0–34.0)
MCHC: 34.4 g/dL (ref 30.0–36.0)
MCV: 87.7 fL (ref 80.0–100.0)
Platelets: 326 10*3/uL (ref 150–400)
RBC: 4.71 MIL/uL (ref 4.22–5.81)
RDW: 12.4 % (ref 11.5–15.5)
WBC: 10.3 10*3/uL (ref 4.0–10.5)
nRBC: 0 % (ref 0.0–0.2)

## 2023-06-16 NOTE — ED Triage Notes (Signed)
Pt reports HTN with BP of 190s/110s, reports HA and chest pressure that started yesterday, also having ShoB & dizziness with exertion

## 2023-06-17 ENCOUNTER — Emergency Department (HOSPITAL_BASED_OUTPATIENT_CLINIC_OR_DEPARTMENT_OTHER)
Admission: EM | Admit: 2023-06-17 | Discharge: 2023-06-17 | Discharge status: Against Medical Advice | Payer: Self-pay | Attending: Emergency Medicine | Admitting: Emergency Medicine

## 2023-06-17 DIAGNOSIS — I10 Essential (primary) hypertension: Secondary | ICD-10-CM

## 2023-06-17 LAB — BASIC METABOLIC PANEL
Anion gap: 10 (ref 5–15)
BUN: 19 mg/dL (ref 6–20)
CO2: 27 mmol/L (ref 22–32)
Calcium: 9.5 mg/dL (ref 8.9–10.3)
Chloride: 101 mmol/L (ref 98–111)
Creatinine, Ser: 1.03 mg/dL (ref 0.61–1.24)
GFR, Estimated: >60 mL/min (ref >60)
Glucose, Bld: 149 mg/dL — ABNORMAL HIGH (ref 70–99)
Potassium: 3.5 mmol/L (ref 3.5–5.1)
Sodium: 138 mmol/L (ref 135–145)

## 2023-06-17 LAB — TROPONIN I (HIGH SENSITIVITY)
Troponin I (High Sensitivity): 2 ng/L (ref <18)
Troponin I (High Sensitivity): 2 ng/L (ref <18)

## 2023-06-17 MED ORDER — ACETAMINOPHEN 500 MG PO TABS
1000.0000 mg | ORAL_TABLET | Freq: Once | ORAL | Status: DC
  Filled 2023-06-17: qty 2

## 2023-06-17 NOTE — ED Provider Notes (Signed)
[facility] Provider Note  CSN: [id number] Arrival date & time: 06/16/23 2316  Chief Complaint(s) Chest Pain  HPI Joseph Foley is a 43 y.o. male with a past medical history listed below including hypertension and migraines here for headache for 2 days.  Patient noted that his blood pressures were elevated during several checks in the last 2 days.  He reports that the highest blood pressure was systolic of 250s.  Reports that his normal blood pressures are in the 120s.  States that he is currently on amlodipine 5 mg daily that was switched from lisinopril over a year ago.  States that he is compliant with this medication.  He also endorsed mild shortness of breath and chest discomfort over the last 2 days.  He denies any recent fevers or infections.  No coughing or congestion.  No nausea or vomiting.  No illicit drug use, caffeine use, OTC congestion medicine use.  The history is provided by the patient.     Past Medical History Past Medical History:  Diagnosis Date   Hypertension    Kidney stones    Migraine    There are no active problems to display for this patient.  Home Medication(s) Prior to Admission medications   Medication Sig Start Date End Date Taking? Authorizing Provider  FLUoxetine (PROZAC) 10 MG capsule Take one capsule daily for one week then take 2 capsules daily 11/29/19   [provider]  lisinopril (PRINIVIL,ZESTRIL) 20 MG tablet Take 20 mg by mouth daily.    [provider]  sildenafil (VIAGRA) 100 MG tablet Take by mouth. 11/29/19   [provider]  traZODone (DESYREL) 100 MG tablet Take 1 1 /2-2 tablets at bedtime 11/29/19   [provider]                                                                                                                                    Allergies Patient has no known allergies.  Review of Systems Review of Systems As noted in HPI  Physical  Exam Vital Signs  I have reviewed the triage vital signs BP (!) 181/107 (BP Location: Right Arm)   Pulse 100   Temp 98.5 F (36.9 C) (Oral)   Resp 18   Ht 5\' 7"  (1.702 m)   Wt 95.3 kg   SpO2 99%   BMI 32.89 kg/m   Physical Exam Vitals reviewed.  Constitutional:      General: He is not in acute distress.    Appearance: He is well-developed. He is not diaphoretic.  HENT:     Head: Normocephalic and atraumatic.     Nose: Nose normal.  Eyes:     General: No scleral icterus.       Right eye: No discharge.        Left eye: No discharge.     Conjunctiva/sclera: Conjunctivae normal.  Pupils: Pupils are equal, round, and reactive to light.  Cardiovascular:     Rate and Rhythm: Normal rate and regular rhythm.     Heart sounds: No murmur heard.    No friction rub. No gallop.  Pulmonary:     Effort: Pulmonary effort is normal. No respiratory distress.     Breath sounds: Normal breath sounds. No stridor. No rales.  Abdominal:     General: There is no distension.     Palpations: Abdomen is soft.     Tenderness: There is no abdominal tenderness.  Musculoskeletal:        General: No tenderness.     Cervical back: Normal range of motion and neck supple.  Skin:    General: Skin is warm and dry.     Findings: No erythema or rash.  Neurological:     Mental Status: He is alert and oriented to person, place, and time.     ED Results and Treatments Labs (all labs ordered are listed, but only abnormal results are displayed) Labs Reviewed  BASIC METABOLIC PANEL - Abnormal; Notable for the following components:      Result Value   Glucose, Bld 149 (*)    All other components within normal limits  CBC  TROPONIN I (HIGH SENSITIVITY)  TROPONIN I (HIGH SENSITIVITY)                                                                                                                         EKG  EKG Interpretation Date/Time:  Thursday June 16 2023 23:31:00 EST Ventricular Rate:   105 PR Interval:  126 QRS Duration:  88 QT Interval:  324 QTC Calculation: 429 R Axis:   62  Text Interpretation: Sinus tachycardia Probable inferior infarct, age indeterminate Lateral leads are also involved No significant change was found Confirmed by Drema Pry [phone]) on 06/17/2023 12:56:51 AM       Radiology DG Chest 2 View Result Date: 06/16/2023 CLINICAL DATA:  Chest pain EXAM: CHEST - 2 VIEW COMPARISON:  06/18/2020 FINDINGS: The heart size and mediastinal contours are within normal limits. Both lungs are clear. The visualized skeletal structures are unremarkable. IMPRESSION: No active cardiopulmonary disease. Electronically Signed   By: Jasmine Pang M.D.   On: 06/16/2023 23:45    Medications Ordered in ED Medications  acetaminophen (TYLENOL) tablet 1,000 mg (1,000 mg Oral Patient Refused/Not Given 06/17/23 0245)   Procedures Procedures  (including critical care time) Medical Decision Making / ED Course   Medical Decision Making Amount and/or Complexity of Data Reviewed Labs: ordered. Decision-making details documented in ED Course. Radiology: ordered and independent interpretation performed. Decision-making details documented in ED Course.  Risk OTC drugs.    Patient presented for elevated blood pressures with headache chest discomfort. Initial blood pressures were elevated with systolics in the 180s and 190s.  This was with a cuff that was too small for his arm.  When I placed the appropriate blood pressure cuff, his systolics  were in the 150s on consecutive checks.  Blood pressure did rise when patient stood with systolics in the 170s.  Patient is not volume overloaded and chest x-ray without evidence of pulmonary edema that would be concerning for heart failure. EKG without acute ischemic changes or evidence of pericarditis.  Serial troponins negative x 2.  Offered to treat patient's headache with Tylenol and recheck blood pressures but patient refused,  stating that he was only here to get medicine for blood pressure.  I informed the patient that given his systolics in the 150s, that it would be best to treat his headache, monitor, and reevaluate before considering additional antihypertensive medication.  Discussed numerous possibilities for blood pressure being elevated but ultimately informed him that we were unable to determine the cause of the elevated blood pressure at this time.  Patient seemed frustrated and walked out prior to additional management.      Final Clinical Impression(s) / ED Diagnoses Final diagnoses:  Hypertension, unspecified type    This chart was dictated using voice recognition software.  Despite best efforts to proofread,  errors can occur which can change the documentation meaning.    Nira Conn, MD 06/17/23 [phone]

## 2024-03-05 ENCOUNTER — Emergency Department (HOSPITAL_BASED_OUTPATIENT_CLINIC_OR_DEPARTMENT_OTHER)
Admission: EM | Admit: 2024-03-05 | Discharge: 2024-03-05 | Disposition: A | Discharge status: Home / Self Care | Attending: Emergency Medicine | Admitting: Emergency Medicine

## 2024-03-05 ENCOUNTER — Emergency Department (HOSPITAL_BASED_OUTPATIENT_CLINIC_OR_DEPARTMENT_OTHER)

## 2024-03-05 ENCOUNTER — Encounter (HOSPITAL_BASED_OUTPATIENT_CLINIC_OR_DEPARTMENT_OTHER): Payer: Self-pay | Admitting: Emergency Medicine

## 2024-03-05 ENCOUNTER — Other Ambulatory Visit: Payer: Self-pay

## 2024-03-05 DIAGNOSIS — R0789 Other chest pain: Secondary | ICD-10-CM | POA: Diagnosis not present

## 2024-03-05 DIAGNOSIS — E876 Hypokalemia: Secondary | ICD-10-CM | POA: Insufficient documentation

## 2024-03-05 DIAGNOSIS — M79632 Pain in left forearm: Secondary | ICD-10-CM | POA: Diagnosis not present

## 2024-03-05 DIAGNOSIS — I10 Essential (primary) hypertension: Secondary | ICD-10-CM | POA: Diagnosis not present

## 2024-03-05 DIAGNOSIS — Z79899 Other long term (current) drug therapy: Secondary | ICD-10-CM | POA: Insufficient documentation

## 2024-03-05 DIAGNOSIS — R079 Chest pain, unspecified: Secondary | ICD-10-CM | POA: Diagnosis present

## 2024-03-05 LAB — BASIC METABOLIC PANEL WITH GFR
Anion gap: 14 (ref 5–15)
BUN: 12 mg/dL (ref 6–20)
CO2: 26 mmol/L (ref 22–32)
Calcium: 9.4 mg/dL (ref 8.9–10.3)
Chloride: 100 mmol/L (ref 98–111)
Creatinine, Ser: 0.9 mg/dL (ref 0.61–1.24)
GFR, Estimated: >60 mL/min (ref >60)
Glucose, Bld: 99 mg/dL (ref 70–99)
Potassium: 3.3 mmol/L — ABNORMAL LOW (ref 3.5–5.1)
Sodium: 139 mmol/L (ref 135–145)

## 2024-03-05 LAB — TROPONIN T, HIGH SENSITIVITY
Troponin T High Sensitivity: <15 ng/L (ref 0–19)
Troponin T High Sensitivity: <15 ng/L (ref 0–19)

## 2024-03-05 LAB — CBC
HCT: 41 % (ref 39.0–52.0)
Hemoglobin: 14 g/dL (ref 13.0–17.0)
MCH: 30.8 pg (ref 26.0–34.0)
MCHC: 34.1 g/dL (ref 30.0–36.0)
MCV: 90.1 fL (ref 80.0–100.0)
Platelets: 338 K/uL (ref 150–400)
RBC: 4.55 MIL/uL (ref 4.22–5.81)
RDW: 12.8 % (ref 11.5–15.5)
WBC: 9.9 K/uL (ref 4.0–10.5)
nRBC: 0 % (ref 0.0–0.2)

## 2024-03-05 LAB — URINE DRUG SCREEN
Amphetamines: POSITIVE — AB
Barbiturates: NEGATIVE
Benzodiazepines: NEGATIVE
Cocaine: NEGATIVE
Fentanyl: NEGATIVE
Methadone Scn, Ur: NEGATIVE
Opiates: NEGATIVE
Tetrahydrocannabinol: NEGATIVE

## 2024-03-05 LAB — D-DIMER, QUANTITATIVE: D-Dimer, Quant: <0.27 ug{FEU}/mL (ref 0.00–0.50)

## 2024-03-05 MED ORDER — SODIUM CHLORIDE 0.9 % IV BOLUS
1000.0000 mL | Freq: Once | INTRAVENOUS | Status: AC
  Administered 2024-03-05: 1000 mL via INTRAVENOUS

## 2024-03-05 NOTE — ED Notes (Signed)
 Pt states since sticking him for IV and fluid administration, he has felt sob. Lavanda, PA notified

## 2024-03-05 NOTE — ED Triage Notes (Signed)
 Pt c/o chest pain x 3-4 days, worse today.

## 2024-03-05 NOTE — ED Notes (Signed)
 Pt to bathroom for urine sample.

## 2024-03-05 NOTE — ED Provider Notes (Signed)
 [facility] Provider Note   CSN: [id number] Arrival date & time: 03/05/24  1539     Patient presents with: Chest Pain   Joseph Foley is a 43 y.o. male who presents to the emergency department with a chief complaint of chest pain.  Patient reports he has been having intermittent episodes of sharp left-sided chest pain for several months.  Patient reports that intermittent episodes of chest pain on the left for the past 3 to 4 days that have been progressively worsening.  Patient states that it seems worse at night he frequently has to get up out of bed and walk around which relieves his pain.  Patient reports that he was at work today when he had onset of sharp chest pain with associated left forearm pain simultaneously.  He states that patient people at work noticed him he said when it occurs it feels like he is getting kicked or punched and he loses his breath.  He has not had any nausea or exertional chest pain he was able to complete a full work out this morning without any symptoms.  He denies unilateral leg swelling recent confinement or surgeries history of blood clots or PEs he has a strong family history of cardiac disease.  Patient reports that all of the males in his family have died before the age of 82 from MI.  His grandfather died of MI in his 59s.  He has a history of hypertension but denies hyperlipidemia smoking diabetes.  He has seen cardiology in the past.  He denies any recent viral symptoms.  Pain is not worse with movement of the arm.    Chest Pain      Prior to Admission medications   Medication Sig Start Date End Date Taking? Authorizing Provider  zolpidem (AMBIEN CR) 12.5 MG CR tablet Take 12.5 mg by mouth at bedtime as needed for sleep. 02/17/24 03/18/24 Yes [provider]  FLUoxetine (PROZAC) 10 MG capsule Take one capsule daily for one week then take 2 capsules daily 11/29/19   [provider]   lisinopril (PRINIVIL,ZESTRIL) 20 MG tablet Take 20 mg by mouth daily.    [provider]  sildenafil (VIAGRA) 100 MG tablet Take by mouth. 11/29/19   [provider]  traZODone (DESYREL) 100 MG tablet Take 1 1 /2-2 tablets at bedtime 11/29/19   [provider]    Allergies: Patient has no known allergies.    Review of Systems  Cardiovascular:  Positive for chest pain.    Updated Vital Signs BP (!) 150/86   Pulse 88   Temp 97.9 F (36.6 C) (Oral)   Resp 18   Ht 5' 7 (1.702 m)   SpO2 100%   BMI 32.89 kg/m   Physical Exam  (all labs ordered are listed, but only abnormal results are displayed) Labs Reviewed  BASIC METABOLIC PANEL WITH GFR - Abnormal; Notable for the following components:      Result Value   Potassium 3.3 (*)    All other components within normal limits  URINE DRUG SCREEN - Abnormal; Notable for the following components:   Amphetamines POSITIVE (*)    All other components within normal limits  CBC  D-DIMER, QUANTITATIVE  TSH  TROPONIN T, HIGH SENSITIVITY  TROPONIN T, HIGH SENSITIVITY    EKG: None  Radiology: DG Chest 2 View Result Date: 03/05/2024 EXAM: 2 VIEW(S) XRAY OF THE CHEST 03/05/2024 04:00:00 PM COMPARISON: 06/16/2023 CLINICAL HISTORY: chest pain  FINDINGS: LUNGS AND PLEURA: No focal pulmonary opacity. No pulmonary edema. No pleural effusion. No pneumothorax. HEART AND MEDIASTINUM: No acute abnormality of the cardiac and mediastinal silhouettes. BONES AND SOFT TISSUES: BB in right upper chest unchanged. No acute osseous abnormality. IMPRESSION: 1. No acute cardiopulmonary process. Electronically signed by: Waddell Calk MD 03/05/2024 04:40 PM EST RP Workstation: [id number]     Procedures   Medications Ordered in the ED  sodium chloride  0.9 % bolus 1,000 mL (0 mLs Intravenous Stopped 03/05/24 2011)    Clinical Course as of 03/05/24 2024  Mon Mar 05, 2024  1953 Amphetamines(!): POSITIVE [AH]  2020 PDMP reveiwed-  Patient is prescribed 70 Vyvanse and 20 dextroamphetamine daily [AH]    Clinical Course User Index [AH] Arloa Chroman, PA-C             HEART Score: 1                    Medical Decision Making Given the large differential diagnosis for Joseph Foley, the decision making in this case is of high complexity.  After evaluating all of the data points in this case, the presentation of Joseph Foley is NOT consistent with Acute Coronary Syndrome (ACS) and/or myocardial ischemia, pulmonary embolism, aortic dissection; Borhaave's, significant arrythmia, pneumothorax, cardiac tamponade, or other emergent cardiopulmonary condition.  Further, the presentation of Joseph Foley is NOT consistent with pericarditis, myocarditis, cholecystitis, pancreatitis, mediastinitis, endocarditis, new valvular disease.  Additionally, the presentation of Joseph Foley NOT consistent with flail chest, cardiac contusion, ARDS, or significant intra-thoracic or intra-abdominal bleeding.  Moreover, this presentation is NOT consistent with pneumonia, sepsis, or pyelonephritis.  The patient has a HEART Score: 1    Strict return and follow-up precautions have been given by me personally or by detailed written instruction given verbally by nursing staff using the teach back method to the patient/family/caregiver(s).  Data Reviewed/Counseling: I have reviewed the patient's vital signs, nursing notes, and other relevant tests/information. I had a detailed discussion regarding the historical points, exam findings, and any diagnostic results supporting the discharge diagnosis. I also discussed the need for outpatient follow-up and the need to return to the ED if symptoms worsen or if there are any questions or concerns that arise at home.    Amount and/or Complexity of Data Reviewed Labs: ordered. Decision-making details documented in ED Course.    Details: Mild hypokalemia negative D-dimer, 2 negative troponins, TSH in  process may be followed up with PCP if labs unremarkable Radiology: ordered and independent interpretation performed.    Details: I personally visualized and interpreted the images using our PACS system. Acute findings include:  No abnormal findings on  CXR  ECG/medicine tests: independent interpretation performed.    Details: EKG shows sinus tachycardia at a rate of 100 Normal intervals         Final diagnoses:  Atypical chest pain    ED Discharge Orders     None          Arloa Chroman, PA-C 03/05/24 2025    Emil Share, DO 03/05/24 2128

## 2024-03-05 NOTE — Discharge Instructions (Addendum)

## 2024-03-06 LAB — TSH: TSH: 1.27 u[IU]/mL (ref 0.350–4.500)
